# Patient Record
Sex: Male | Born: 1945
Health system: Southern US, Community
[De-identification: ages and names within clinical notes are randomized; demographics above are authoritative.]

## PROBLEM LIST (undated history)

## (undated) DIAGNOSIS — E7800 Pure hypercholesterolemia, unspecified: Secondary | ICD-10-CM

## (undated) DIAGNOSIS — K219 Gastro-esophageal reflux disease without esophagitis: Secondary | ICD-10-CM

## (undated) DIAGNOSIS — K56609 Unspecified intestinal obstruction, unspecified as to partial versus complete obstruction: Secondary | ICD-10-CM

## (undated) DIAGNOSIS — I10 Essential (primary) hypertension: Secondary | ICD-10-CM

## (undated) DIAGNOSIS — M1990 Unspecified osteoarthritis, unspecified site: Secondary | ICD-10-CM

## (undated) DIAGNOSIS — F4310 Post-traumatic stress disorder, unspecified: Secondary | ICD-10-CM

## (undated) HISTORY — PX: KNEE ARTHROSCOPY: SHX127

## (undated) HISTORY — PX: ESOPHAGOGASTRODUODENOSCOPY: SHX1529

## (undated) HISTORY — PX: OTHER SURGICAL HISTORY: SHX169

## (undated) HISTORY — PX: COLONOSCOPY: SHX174

## (undated) HISTORY — PX: BACK SURGERY: SHX140

---

## 2006-01-31 ENCOUNTER — Ambulatory Visit: Payer: Self-pay | Admitting: Internal Medicine

## 2006-01-31 ENCOUNTER — Ambulatory Visit (HOSPITAL_COMMUNITY): Admission: RE | Admit: 2006-01-31 | Discharge: 2006-01-31 | Payer: Self-pay | Admitting: Internal Medicine

## 2006-06-17 ENCOUNTER — Ambulatory Visit (HOSPITAL_COMMUNITY): Admission: RE | Admit: 2006-06-17 | Discharge: 2006-06-17 | Payer: Self-pay | Admitting: Family Medicine

## 2007-09-28 ENCOUNTER — Ambulatory Visit (HOSPITAL_COMMUNITY): Admission: RE | Admit: 2007-09-28 | Discharge: 2007-09-28 | Payer: Self-pay | Admitting: Family Medicine

## 2011-01-25 NOTE — Op Note (Signed)
NAME:  Garrett Higgins, Garrett Higgins               ACCOUNT NO.:  [id number]   MEDICAL RECORD NO.:  [id number]          PATIENT TYPE:  AMB   LOCATION:  DAY                           FACILITY:  [facility]   PHYSICIAN:  Lionel December, M.D.    DATE OF BIRTH:  06-Aug-1946   DATE OF PROCEDURE:  01/31/2006  DATE OF DISCHARGE:                                 OPERATIVE REPORT   PROCEDURE:  Colonoscopy.   INDICATIONS FOR PROCEDURE:  Garrett Higgins is a 65 year old Caucasian male who is  here for screening colonoscopy.  Family history is negative for colorectal  carcinoma.  The procedure risks were reviewed with the patient and informed  consent was obtained.   MEDS FOR CONSCIOUS SEDATION:  Demerol 25 mg IV, Versed 5 mg IV.   FINDINGS:  The procedure was performed in the endoscopy suite.  The  patient's vital signs and O2 sats were monitored during the procedure and  remained stable.  The patient was placed in the left lateral position and  rectal examination performed.  No abnormality was noted on external or  digital exam.  The Olympus videoscope was placed in the rectum and advanced  under vision in the sigmoid colon and beyond.  Preparation was satisfactory.  The scope was passed in the cecum which was identified by the appendiceal  orifice and ileocecal valve.  A short segment of TI was also exam and was  normal.  As the scope was withdrawn, the colonic mucosa was carefully  examined.  There are no polyps or other mucosal abnormalities.  The rectal  mucosa, similarly, was normal.  The scope was retroflexed to examine the  anorectal junction and small hemorrhoids were noted below the dentate line.  The endoscope was straightened and withdrawn.  The patient tolerated the  procedure well.   FINAL DIAGNOSIS:  Small external hemorrhoids, otherwise, normal colonoscopy.   RECOMMENDATIONS:  1.  He will resume his usual meds and diet.  2.  Yearly Hemoccults and he may consider next screening exam in ten years      from  now.      Lionel December, M.D.  Electronically Signed     NR/MEDQ  D:  01/31/2006  T:  01/31/2006  Job:  [id number]   cc:   Patrica Duel, M.D.  Fax: [phone]

## 2011-10-17 ENCOUNTER — Other Ambulatory Visit (HOSPITAL_COMMUNITY): Payer: Self-pay | Admitting: Internal Medicine

## 2011-10-17 ENCOUNTER — Ambulatory Visit (HOSPITAL_COMMUNITY)
Admission: RE | Admit: 2011-10-17 | Discharge: 2011-10-17 | Disposition: A | Discharge status: Home / Self Care | Payer: Medicare HMO | Source: Ambulatory Visit | Attending: Internal Medicine | Admitting: Internal Medicine

## 2011-10-17 DIAGNOSIS — R05 Cough: Secondary | ICD-10-CM

## 2011-10-17 DIAGNOSIS — J069 Acute upper respiratory infection, unspecified: Secondary | ICD-10-CM

## 2011-10-17 DIAGNOSIS — R059 Cough, unspecified: Secondary | ICD-10-CM | POA: Insufficient documentation

## 2012-02-29 ENCOUNTER — Emergency Department (HOSPITAL_COMMUNITY): Payer: Medicare HMO

## 2012-02-29 ENCOUNTER — Inpatient Hospital Stay (HOSPITAL_COMMUNITY)
Admission: EM | Admit: 2012-02-29 | Discharge: 2012-03-01 | DRG: 812 | Disposition: A | Discharge status: Home / Self Care | Payer: Medicare HMO | Attending: Internal Medicine | Admitting: Internal Medicine

## 2012-02-29 ENCOUNTER — Encounter (HOSPITAL_COMMUNITY): Payer: Self-pay

## 2012-02-29 DIAGNOSIS — K2980 Duodenitis without bleeding: Secondary | ICD-10-CM | POA: Diagnosis present

## 2012-02-29 DIAGNOSIS — E785 Hyperlipidemia, unspecified: Secondary | ICD-10-CM | POA: Diagnosis present

## 2012-02-29 DIAGNOSIS — R112 Nausea with vomiting, unspecified: Secondary | ICD-10-CM

## 2012-02-29 DIAGNOSIS — D649 Anemia, unspecified: Secondary | ICD-10-CM | POA: Diagnosis present

## 2012-02-29 DIAGNOSIS — I10 Essential (primary) hypertension: Secondary | ICD-10-CM | POA: Diagnosis present

## 2012-02-29 DIAGNOSIS — Z79899 Other long term (current) drug therapy: Secondary | ICD-10-CM

## 2012-02-29 DIAGNOSIS — F17200 Nicotine dependence, unspecified, uncomplicated: Secondary | ICD-10-CM | POA: Diagnosis present

## 2012-02-29 DIAGNOSIS — K219 Gastro-esophageal reflux disease without esophagitis: Secondary | ICD-10-CM | POA: Diagnosis present

## 2012-02-29 DIAGNOSIS — K449 Diaphragmatic hernia without obstruction or gangrene: Secondary | ICD-10-CM | POA: Diagnosis present

## 2012-02-29 DIAGNOSIS — D131 Benign neoplasm of stomach: Secondary | ICD-10-CM | POA: Diagnosis present

## 2012-02-29 DIAGNOSIS — D509 Iron deficiency anemia, unspecified: Principal | ICD-10-CM | POA: Diagnosis present

## 2012-02-29 DIAGNOSIS — E7800 Pure hypercholesterolemia, unspecified: Secondary | ICD-10-CM | POA: Diagnosis present

## 2012-02-29 HISTORY — DX: Essential (primary) hypertension: I10

## 2012-02-29 HISTORY — DX: Pure hypercholesterolemia, unspecified: E78.00

## 2012-02-29 HISTORY — DX: Gastro-esophageal reflux disease without esophagitis: K21.9

## 2012-02-29 LAB — BASIC METABOLIC PANEL
BUN: 20 mg/dL (ref 6–23)
CO2: 22 mEq/L (ref 19–32)
Chloride: 105 mEq/L (ref 96–112)
Creatinine, Ser: 1.04 mg/dL (ref 0.50–1.35)
Glucose, Bld: 110 mg/dL — ABNORMAL HIGH (ref 70–99)
Potassium: 4.6 mEq/L (ref 3.5–5.1)

## 2012-02-29 LAB — IRON AND TIBC
Iron: 11 ug/dL — ABNORMAL LOW (ref 42–135)
UIBC: 521 ug/dL — ABNORMAL HIGH (ref 125–400)

## 2012-02-29 LAB — HEPATIC FUNCTION PANEL
ALT: 12 U/L (ref 0–53)
AST: 16 U/L (ref 0–37)
Alkaline Phosphatase: 74 U/L (ref 39–117)
Bilirubin, Direct: <0.1 mg/dL (ref 0.0–0.3)
Total Bilirubin: 0.2 mg/dL — ABNORMAL LOW (ref 0.3–1.2)

## 2012-02-29 LAB — DIFFERENTIAL
Basophils Absolute: 0 10*3/uL (ref 0.0–0.1)
Basophils Relative: 0 % (ref 0–1)
Lymphocytes Relative: 15 % (ref 12–46)
Lymphs Abs: 1 10*3/uL (ref 0.7–4.0)
Monocytes Relative: 5 % (ref 3–12)
Neutro Abs: 5.3 10*3/uL (ref 1.7–7.7)

## 2012-02-29 LAB — RETICULOCYTES: Retic Count, Absolute: 57 10*3/uL (ref 19.0–186.0)

## 2012-02-29 LAB — VITAMIN B12: Vitamin B-12: 254 pg/mL (ref 211–911)

## 2012-02-29 LAB — CBC
HCT: 26.1 % — ABNORMAL LOW (ref 39.0–52.0)
Hemoglobin: 7.3 g/dL — ABNORMAL LOW (ref 13.0–17.0)
MCV: 68.3 fL — ABNORMAL LOW (ref 78.0–100.0)
RDW: 18.7 % — ABNORMAL HIGH (ref 11.5–15.5)
WBC: 6.8 10*3/uL (ref 4.0–10.5)

## 2012-02-29 LAB — ABO/RH
ABO/RH(D): AB POS
ABO/RH(D): AB POS

## 2012-02-29 LAB — FOLATE: Folate: 18.1 ng/mL

## 2012-02-29 MED ORDER — PANTOPRAZOLE SODIUM 40 MG PO TBEC
40.0000 mg | DELAYED_RELEASE_TABLET | Freq: Every day | ORAL | Status: DC
  Administered 2012-02-29 – 2012-03-01 (×2): 40 mg via ORAL
  Filled 2012-02-29 (×2): qty 1

## 2012-02-29 MED ORDER — HYDROCODONE-ACETAMINOPHEN 5-325 MG PO TABS
1.0000 | ORAL_TABLET | ORAL | Status: DC | PRN
  Administered 2012-02-29 – 2012-03-01 (×2): 1 via ORAL
  Filled 2012-02-29 (×2): qty 1

## 2012-02-29 MED ORDER — SODIUM CHLORIDE 0.9 % IV SOLN: 250.0000 mL | INTRAVENOUS | Status: DC | PRN

## 2012-02-29 MED ORDER — SODIUM CHLORIDE 0.9 % IJ SOLN
3.0000 mL | INTRAMUSCULAR | Status: DC | PRN
  Filled 2012-02-29: qty 3

## 2012-02-29 MED ORDER — LISINOPRIL 10 MG PO TABS
10.0000 mg | ORAL_TABLET | Freq: Every day | ORAL | Status: DC
  Administered 2012-03-01: 10 mg via ORAL
  Filled 2012-02-29 (×2): qty 1

## 2012-02-29 MED ORDER — SODIUM CHLORIDE 0.9 % IV BOLUS (SEPSIS)
250.0000 mL | Freq: Once | INTRAVENOUS | Status: AC
  Administered 2012-02-29: 250 mL via INTRAVENOUS

## 2012-02-29 MED ORDER — ONDANSETRON HCL 4 MG/2ML IJ SOLN: 4.0000 mg | Freq: Four times a day (QID) | INTRAMUSCULAR | Status: DC | PRN

## 2012-02-29 MED ORDER — ONDANSETRON HCL 4 MG PO TABS: 4.0000 mg | ORAL_TABLET | Freq: Four times a day (QID) | ORAL | Status: DC | PRN

## 2012-02-29 MED ORDER — SODIUM CHLORIDE 0.9 % IJ SOLN
3.0000 mL | Freq: Two times a day (BID) | INTRAMUSCULAR | Status: DC
  Administered 2012-02-29 – 2012-03-01 (×2): 3 mL via INTRAVENOUS
  Filled 2012-02-29: qty 3

## 2012-02-29 MED ORDER — SODIUM CHLORIDE 0.9 % IV SOLN: INTRAVENOUS | Status: DC

## 2012-02-29 MED ORDER — HYDROCHLOROTHIAZIDE 25 MG PO TABS
12.5000 mg | ORAL_TABLET | ORAL | Status: DC
  Administered 2012-03-01: 12.5 mg via ORAL
  Filled 2012-02-29: qty 0.5
  Filled 2012-02-29: qty 1

## 2012-02-29 MED ORDER — ATORVASTATIN CALCIUM 40 MG PO TABS
80.0000 mg | ORAL_TABLET | Freq: Every day | ORAL | Status: DC
  Administered 2012-02-29 – 2012-03-01 (×2): 80 mg via ORAL
  Filled 2012-02-29: qty 2
  Filled 2012-02-29: qty 1
  Filled 2012-02-29: qty 2

## 2012-02-29 MED ORDER — ZOLPIDEM TARTRATE 5 MG PO TABS
10.0000 mg | ORAL_TABLET | Freq: Every evening | ORAL | Status: DC | PRN
  Administered 2012-02-29: 10 mg via ORAL
  Filled 2012-02-29: qty 2
  Filled 2012-02-29: qty 1
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
## 2012-02-29 NOTE — ED Notes (Signed)
Talked with edp about blood. Advised to order new blood and test again per labs suggestion. Pt aware.

## 2012-02-29 NOTE — ED Notes (Signed)
Pt states he went to the [place] yesterday. States he was told he need a blood transfusion
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
## 2012-03-01 ENCOUNTER — Encounter (HOSPITAL_COMMUNITY): Payer: Self-pay | Admitting: *Deleted

## 2012-03-01 ENCOUNTER — Encounter (HOSPITAL_COMMUNITY)
Admission: EM | Disposition: A | Discharge status: Home / Self Care | Payer: Self-pay | Source: Home / Self Care | Attending: Internal Medicine

## 2012-03-01 ENCOUNTER — Inpatient Hospital Stay (HOSPITAL_COMMUNITY): Payer: Medicare HMO

## 2012-03-01 DIAGNOSIS — R112 Nausea with vomiting, unspecified: Secondary | ICD-10-CM

## 2012-03-01 DIAGNOSIS — D509 Iron deficiency anemia, unspecified: Secondary | ICD-10-CM

## 2012-03-01 DIAGNOSIS — D131 Benign neoplasm of stomach: Secondary | ICD-10-CM

## 2012-03-01 DIAGNOSIS — K449 Diaphragmatic hernia without obstruction or gangrene: Secondary | ICD-10-CM

## 2012-03-01 DIAGNOSIS — I10 Essential (primary) hypertension: Secondary | ICD-10-CM

## 2012-03-01 HISTORY — PX: ESOPHAGOGASTRODUODENOSCOPY: SHX5428

## 2012-03-01 LAB — CBC
MCH: 21.1 pg — ABNORMAL LOW (ref 26.0–34.0)
MCHC: 29.2 g/dL — ABNORMAL LOW (ref 30.0–36.0)
MCV: 72.4 fL — ABNORMAL LOW (ref 78.0–100.0)
Platelets: 217 10*3/uL (ref 150–400)

## 2012-03-01 LAB — TYPE AND SCREEN
Antibody Screen: NEGATIVE
Unit division: 0

## 2012-03-01 LAB — COMPREHENSIVE METABOLIC PANEL
ALT: 11 U/L (ref 0–53)
AST: 13 U/L (ref 0–37)
Alkaline Phosphatase: 73 U/L (ref 39–117)
CO2: 24 mEq/L (ref 19–32)
Chloride: 105 mEq/L (ref 96–112)
Creatinine, Ser: 1.01 mg/dL (ref 0.50–1.35)
GFR calc non Af Amer: 74 mL/min — ABNORMAL LOW (ref >90)
Potassium: 4.5 mEq/L (ref 3.5–5.1)
Sodium: 138 mEq/L (ref 135–145)
Total Bilirubin: 0.8 mg/dL (ref 0.3–1.2)

## 2012-03-01 SURGERY — EGD (ESOPHAGOGASTRODUODENOSCOPY)
Anesthesia: Moderate Sedation

## 2012-03-01 MED ORDER — SODIUM CHLORIDE 0.9 % IV SOLN
250.0000 mL | INTRAVENOUS | Status: DC | PRN
  Administered 2012-03-01: 250 mL via INTRAVENOUS

## 2012-03-01 MED ORDER — BUTAMBEN-TETRACAINE-BENZOCAINE 2-2-14 % EX AERO
INHALATION_SPRAY | CUTANEOUS | Status: DC | PRN
  Administered 2012-03-01: 2 via TOPICAL

## 2012-03-01 MED ORDER — MIDAZOLAM HCL 5 MG/5ML IJ SOLN
INTRAMUSCULAR | Status: AC
  Filled 2012-03-01: qty 10

## 2012-03-01 MED ORDER — IOHEXOL 300 MG/ML  SOLN
40.0000 mL | Freq: Once | INTRAMUSCULAR | Status: AC | PRN
  Administered 2012-03-01: 40 mL via ORAL

## 2012-03-01 MED ORDER — IOHEXOL 300 MG/ML  SOLN
100.0000 mL | Freq: Once | INTRAMUSCULAR | Status: AC | PRN
  Administered 2012-03-01: 100 mL via INTRAVENOUS

## 2012-03-01 MED ORDER — MIDAZOLAM HCL 5 MG/5ML IJ SOLN
INTRAMUSCULAR | Status: DC | PRN
  Administered 2012-03-01 (×2): 2 mg via INTRAVENOUS

## 2012-03-01 MED ORDER — FERROUS SULFATE 325 (65 FE) MG PO TBEC: 325.0000 mg | DELAYED_RELEASE_TABLET | Freq: Three times a day (TID) | ORAL | Status: DC

## 2012-03-01 MED ORDER — MEPERIDINE HCL 100 MG/ML IJ SOLN
INTRAMUSCULAR | Status: AC
  Filled 2012-03-01: qty 2

## 2012-03-01 MED ORDER — MEPERIDINE HCL 100 MG/ML IJ SOLN
INTRAMUSCULAR | Status: DC | PRN
  Administered 2012-03-01: 25 mg via INTRAVENOUS
  Administered 2012-03-01: 50 mg via INTRAVENOUS

## 2012-03-01 NOTE — Op Note (Addendum)
[facility] [address] [place], [place]  [address]  ENDOSCOPY PROCEDURE REPORT  PATIENT:  Garrett Higgins, Garrett Higgins  MR#:  [id number] BIRTHDATE:  1946-04-04, 66 yrs. old  GENDER:  male  ENDOSCOPIST:  Jonette Eva, MD Referred by:  Karleen Hampshire, M.D. [facility]  PROCEDURE DATE:  03/01/2012 PROCEDURE:  EGD with biopsy, [id number] ASA CLASS: INDICATIONS:  IRON DEFICIENCY ANEMIA, FERRITIN 4, HB 7.2 & GIVEN 2uPrbcs HB 9.1  LAST TCS 2007: SML EXT HEMORRHOIDS  MEDICATIONS:   Demerol 75 mg IV, Versed 4 mg IV TOPICAL ANESTHETIC:  Cetacaine Spray  DESCRIPTION OF PROCEDURE:     Physical exam was performed. Informed consent was obtained from the patient after explaining the benefits, risks, and alternatives to the procedure.  The patient was connected to the monitor and placed in the left lateral position.  Continuous oxygen was provided by nasal cannula and IV medicine administered through an indwelling cannula.  After administration of sedation, the patient's esophagus was intubated and the EG-2990i ([id number]) and EG-2990i ([id number]) endoscope was advanced under direct visualization to the second portion of the duodenum.  The scope was removed slowly by carefully examining the color, texture, anatomy, and integrity of the mucosa on the way out.  The patient was recovered in endoscopy and discharged home in satisfactory condition. <<PROCEDUREIMAGES>>  NL ESOPHAGUS. NO BARETT'S. A 1-2 CM hiatal hernia was found. There were multiple, BENIGN polyps identified. in the fundus &BIOPSIED VIA COLD FORCEPS.  MILD ERYTHEMA IN TEH DUODENUM-BIOPSIES OBTAINED VIA COLD FORCEPS TO EVALUATE CELIAC SPRUE.  COMPLICATIONS:    None  ENDOSCOPIC IMPRESSION: 1) SMALL Hiatal hernia 2) Polyps, multiple in the fundus 3) NO OBVIOUS SOURCE FOR FEDA IDENTIFIED. 4) MILD DUODENITIS PT LOW RISK FOR BLEEDING  RECOMMENDATIONS: AWAIT BIOPSIES OMP QD LOW FAT DIET Stop the dicloenac. START FESO4 TID OK  TOP D/C HOME. IF PT DOES NOT HAVE ATROPHIC GASTRITIS, NEEDS TCS AS OP. COPY GIVEN TO PT'S WIFE. WILL CALL [phone](PT) OR [phone] WITH RESULTS.  REPEAT EXAM:  No  ______________________________ Jonette Eva, MD  CC:  n. REVISED:  03/01/2012 01:38 PM eSIGNED:   Barret Esquivel at 03/01/2012 01:38 PM  Keturah Barre, [phone]

## 2012-03-01 NOTE — Discharge Summary (Signed)
Physician Discharge Summary  Patient ID: Garrett Higgins MRN: [id number] DOB/AGE: 1945/12/12 66 y.o. Primary Care Physician:MCGOUGH,WILLIAM M, MD Admit date: 02/29/2012 Discharge date: 03/01/2012    Discharge Diagnoses:  1. Iron deficiency anemia. Status post 2 units blood. EGD findings as follows: 1) SMALL Hiatal hernia  2) Polyps, multiple in the fundus  3) NO OBVIOUS SOURCE FOR FEDA IDENTIFIED.  4) MILD DUODENITIS  2. Hypertension. 3. GERD. 4. Hyperlipidemia.    Medication List  As of 03/01/2012  4:39 PM   STOP taking these medications         diclofenac 75 MG EC tablet         TAKE these medications         citalopram 40 MG tablet   Commonly known as: CELEXA   Take 40 mg by mouth daily.      ferrous sulfate 325 (65 FE) MG EC tablet   Take 1 tablet (325 mg total) by mouth 3 (three) times daily with meals.      hydrochlorothiazide 25 MG tablet   Commonly known as: HYDRODIURIL   Take 12.5 mg by mouth every other day.      HYDROcodone-acetaminophen 5-500 MG per tablet   Commonly known as: VICODIN   Take 0.5-1 tablets by mouth 2 (two) times daily as needed. FOR PAIN      lidocaine 2 % jelly   Commonly known as: XYLOCAINE   Apply 1 application topically as needed. FOR BACK PAIN AND JOINT PAIN      lisinopril 20 MG tablet   Commonly known as: PRINIVIL,ZESTRIL   Take 10 mg by mouth daily.      omeprazole 20 MG capsule   Commonly known as: PRILOSEC   Take 20 mg by mouth daily.      rosuvastatin 40 MG tablet   Commonly known as: CRESTOR   Take 20 mg by mouth daily.      sildenafil 100 MG tablet   Commonly known as: VIAGRA   Take 50-100 mg by mouth daily as needed. FOR SEXUAL ACTIVITY      zolpidem 10 MG tablet   Commonly known as: AMBIEN   Take 10 mg by mouth at bedtime as needed. FOR SLEEP            Discharged Condition: Stable.    Consults: Gastroenterology, Dr. Darrick Penna.  Significant Diagnostic Studies: Dg Chest 2 View  02/29/2012  *RADIOLOGY  REPORT*  Clinical Data: 66 year old male with anemia.  CHEST - 2 VIEW  Comparison: 10/17/2011  Findings: The cardiomediastinal silhouette is unremarkable. The lungs are clear. There is no evidence of focal airspace disease, pulmonary edema, suspicious pulmonary nodule/mass, pleural effusion, or pneumothorax. No acute bony abnormalities are identified. Metallic foreign bodies overlying the left chest and back are again noted.  IMPRESSION: No evidence of active cardiopulmonary disease.  Original Report Authenticated By: Rosendo Gros, M.D.   Ct Abdomen Pelvis W Contrast  03/01/2012  *RADIOLOGY REPORT*  Clinical Data: Anorexia and weight loss.  Anemia.  CT ABDOMEN AND PELVIS WITH CONTRAST  Technique:  Multidetector CT imaging of the abdomen and pelvis was performed following the standard protocol during bolus administration of intravenous contrast.  Contrast: OMNIPAQUE IOHEXOL 300 MG/ML  SOLN  Comparison: None.  Findings: The lung bases are clear.  Small pleural lipoma versus diaphragmatic hernia on the left.  The liver is unremarkable.  No focal lesions or intrahepatic biliary dilatation.  The gallbladder is normal.  No common bile duct dilatation.  The pancreas is normal.  The spleen is normal in size.  No focal lesions.  Surgical clips noted near the spleen. The adrenal glands and kidneys are unremarkable.  The stomach, duodenum, small bowel and colon are unremarkable.  No inflammatory changes or mass lesions.  No mesenteric or retroperitoneal masses or lymphadenopathy.  The appendix is normal. The aorta is normal in caliber.  Moderate atherosclerotic calcifications.  The major branch vessels are patent.  Small scattered lymph nodes are noted.  The bladder, prostate gland and seminal vesicles are unremarkable. No pelvic mass or adenopathy.  No inguinal mass or adenopathy.  A small left inguinal hernia is noted containing fat.  The bony structures are unremarkable.  IMPRESSION: No acute abdominal/pelvic  findings, mass lesions or adenopathy.  Original Report Authenticated By: P. Loralie Champagne, M.D.    Lab Results: Basic Metabolic Panel:  Basename 03/01/12 0612 02/29/12 0927  NA 138 137  K 4.5 4.6  CL 105 105  CO2 24 22  GLUCOSE 107* 110*  BUN 12 20  CREATININE 1.01 1.04  CALCIUM 9.9 9.7  MG -- --  PHOS -- --   Liver Function Tests:  [facility] 03/01/12 0612 02/29/12 0927  AST 13 16  ALT 11 12  ALKPHOS 73 74  BILITOT 0.8 0.2*  PROT 6.6 6.9  ALBUMIN 3.6 3.8     CBC:  Basename 03/01/12 0612 02/29/12 0927  WBC 4.5 6.8  NEUTROABS -- 5.3  HGB 9.2* 7.3*  HCT 31.5* 26.1*  MCV 72.4* 68.3*  PLT 217 247       Hospital Course: This 66 year old man presents to the hospital after he was found to be anemic after he has had blood taken from the [place]. 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 Apparently his hemoglobin was clearly different yesterday when it was taken compared to 6 months ago. He was therefore referred to his primary care physician, who referred him to the emergency room. He does describe some lightheadedness and feeling generally tired in the last few weeks. He also describes weight loss, unintentional in the last few weeks. His appetite is rather poor. He denies any hematemesis, rectal bleeding or melena. He has had no abdominal pain. He did have a colonoscopy in 2007 which revealed small external hemorrhoids, otherwise normal. He was given 2 units blood transfusion. He was seen by gastroenterology who performed EGD, the findings are as above. CT scan of his abdomen was done and this was negative. He has remained stable. He is going to be discharged with omeprazole that is already taking. He has been advised to stop diclofenac. He may well need outpatient colonoscopy.  Discharge Exam: Blood pressure 106/70, pulse 67, temperature 97.9 F (36.6 C), temperature source Oral, resp. rate 16, height 5\' 11"  (1.803 m), weight 88.8 kg (195 lb 12.3 oz), SpO2  94.00%. He looks systemically well. Heart sounds present and normal. Lung fields are clear. Abdomen is soft and nontender. He is alert and orientated.  Disposition: Home. He'll follow with  Dr. Darrick Penna.  Discharge Orders    Future Orders Please Complete By Expires   Diet - low sodium heart healthy      Increase activity slowly         Follow-up Information    Follow up with Jonette Eva, MD. Schedule an appointment as soon as possible for a visit in 2 weeks.   Contact information:   [address]  [place] [address] [phone]          SignedWilson Singer Pager [phone]  03/01/2012, 4:39 PM
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
## 2012-03-01 NOTE — Progress Notes (Signed)
Subjective: This man was given 2 units of blood yesterday without any problems. His anemia panel shows clear evidence of iron deficiency. Dr Darrick Penna, gastroenterology, will see him today and likely do EGD today.           Physical Exam: Blood pressure 115/75, pulse 65, temperature 97.9 F (36.6 C), temperature source Oral, resp. rate 16, height 5\' 11"  (1.803 m), weight 88.8 kg (195 lb 12.3 oz), SpO2 100.00%. He looks systemically well. Heart sounds are present and normal. Lung fields are clear. There are no new changes since he was admitted yesterday.        Basic Metabolic Panel:  Basename 03/01/12 0612 02/29/12 0927  NA 138 137  K 4.5 4.6  CL 105 105  CO2 24 22  GLUCOSE 107* 110*  BUN 12 20  CREATININE 1.01 1.04  CALCIUM 9.9 9.7  MG -- --  PHOS -- --   Liver Function Tests:  [facility] 03/01/12 0612 02/29/12 0927  AST 13 16  ALT 11 12  ALKPHOS 73 74  BILITOT 0.8 0.2*  PROT 6.6 6.9  ALBUMIN 3.6 3.8     CBC:  Basename 03/01/12 0612 02/29/12 0927  WBC 4.5 6.8  NEUTROABS -- 5.3  HGB 9.2* 7.3*  HCT 31.5* 26.1*  MCV 72.4* 68.3*  PLT 217 247    Dg Chest 2 View  02/29/2012  *RADIOLOGY REPORT*  Clinical Data: 66 year old male with anemia.  CHEST - 2 VIEW  Comparison: 10/17/2011  Findings: The cardiomediastinal silhouette is unremarkable. The lungs are clear. There is no evidence of focal airspace disease, pulmonary edema, suspicious pulmonary nodule/mass, pleural effusion, or pneumothorax. No acute bony abnormalities are identified. Metallic foreign bodies overlying the left chest and back are again noted.  IMPRESSION: No evidence of active cardiopulmonary disease.  Original Report Authenticated By: Rosendo Gros, M.D.      Medications:  Scheduled:   . atorvastatin  80 mg Oral q1800  . hydrochlorothiazide  12.5 mg Oral QODAY  . lisinopril  10 mg Oral Daily  . pantoprazole  40 mg Oral Q1200  . sodium chloride  250 mL Intravenous Once  . sodium chloride  3  mL Intravenous Q12H    Impression: 1. Iron deficiency anemia, unclear etiology at this time. History of nonsteroidal anti-inflammatory drugs and alcohol. 2. Hypertension. 3. GERD. 4. Hyperlipidemia.     Plan: 1. Keep n.p.o. 2. EGD today.     LOS: 1 day   Wilson Singer Pager [phone]  03/01/2012, 8:28 AM
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
## 2012-03-01 NOTE — Progress Notes (Signed)
Patient discharged home with wife.  Instructed to start taking ferrous sulfate 3 x a day with meals.  And to stop taking diclofonac.  Instructed to make follow up appoint with Dr. Darrick Penna, and that Dr. Darrick Penna should be contacting when results from biopsies are back.  Patient has no questions and feels ready to go home.  IV removed - WNL

## 2012-03-03 ENCOUNTER — Telehealth: Payer: Self-pay | Admitting: Gastroenterology

## 2012-03-03 ENCOUNTER — Encounter (HOSPITAL_COMMUNITY): Payer: Self-pay | Admitting: Gastroenterology

## 2012-03-03 NOTE — Progress Notes (Signed)
UR Chart Review Completed  

## 2012-03-03 NOTE — Telephone Encounter (Signed)
Also received call from Dr. Darrick Penna. We are in the process of scheduling the surgery for colonoscopy.

## 2012-03-03 NOTE — Telephone Encounter (Signed)
CALLED PT. DISCUSSED RESULTS WITH PT. WOULD LIKE DR. Karilyn Cota TO PERFORM HIS REPEAT TCS.

## 2012-03-04 ENCOUNTER — Other Ambulatory Visit (INDEPENDENT_AMBULATORY_CARE_PROVIDER_SITE_OTHER): Payer: Self-pay | Admitting: *Deleted

## 2012-03-04 ENCOUNTER — Telehealth (INDEPENDENT_AMBULATORY_CARE_PROVIDER_SITE_OTHER): Payer: Self-pay | Admitting: *Deleted

## 2012-03-04 ENCOUNTER — Encounter (HOSPITAL_COMMUNITY): Payer: Self-pay

## 2012-03-04 DIAGNOSIS — K625 Hemorrhage of anus and rectum: Secondary | ICD-10-CM

## 2012-03-04 DIAGNOSIS — Z1211 Encounter for screening for malignant neoplasm of colon: Secondary | ICD-10-CM

## 2012-03-04 MED ORDER — PEG-KCL-NACL-NASULF-NA ASC-C 100 G PO SOLR: 1.0000 | Freq: Once | ORAL | Status: DC

## 2012-03-04 NOTE — Telephone Encounter (Signed)
PCP/Requesting MD: mcgough  Name & DOB: rajan burgard 01/27/2046    Procedure: tcs  Reason/Indication:  Rectal bleeding (schd per NUR)  Has patient had this procedure before?  yes  If so, when, by whom and where?  2007  Is there a family history of colon cancer?  no  Who?  What age when diagnosed?    Is patient diabetic?   no      Does patient have prosthetic heart valve?  no  Do you have a pacemaker?  no  Has patient had joint replacement within last 12 months?  no  Is patient on Coumadin, Plavix and/or Aspirin? no  Medications: HCTZ 25 mg 1/2 tab every other day, rosuvastatin 40 mg 1/2 tab daily, hydrocodone 500 mg prn, lisinopril 20 mg 1/2 tab daily, iron 325 mg tid  Allergies: nkda  Medication Adjustment: iron 7 days  Procedure date & time: 03/13/12 at 730

## 2012-03-04 NOTE — Telephone Encounter (Signed)
TCS sch'd 03/13/12, patient aware

## 2012-03-04 NOTE — Telephone Encounter (Signed)
agree

## 2012-03-13 ENCOUNTER — Encounter (HOSPITAL_COMMUNITY): Payer: Self-pay

## 2012-03-13 ENCOUNTER — Ambulatory Visit (HOSPITAL_COMMUNITY)
Admission: RE | Admit: 2012-03-13 | Discharge: 2012-03-13 | Disposition: A | Discharge status: Home Health | Payer: Medicare HMO | Source: Ambulatory Visit | Attending: Internal Medicine | Admitting: Internal Medicine

## 2012-03-13 ENCOUNTER — Encounter (HOSPITAL_COMMUNITY)
Admission: RE | Disposition: A | Discharge status: Home Health | Payer: Self-pay | Source: Ambulatory Visit | Attending: Internal Medicine

## 2012-03-13 DIAGNOSIS — Z01812 Encounter for preprocedural laboratory examination: Secondary | ICD-10-CM | POA: Insufficient documentation

## 2012-03-13 DIAGNOSIS — K625 Hemorrhage of anus and rectum: Secondary | ICD-10-CM

## 2012-03-13 DIAGNOSIS — Z79899 Other long term (current) drug therapy: Secondary | ICD-10-CM | POA: Insufficient documentation

## 2012-03-13 DIAGNOSIS — Z9889 Other specified postprocedural states: Secondary | ICD-10-CM

## 2012-03-13 DIAGNOSIS — D126 Benign neoplasm of colon, unspecified: Secondary | ICD-10-CM | POA: Insufficient documentation

## 2012-03-13 DIAGNOSIS — D509 Iron deficiency anemia, unspecified: Secondary | ICD-10-CM | POA: Insufficient documentation

## 2012-03-13 DIAGNOSIS — E7800 Pure hypercholesterolemia, unspecified: Secondary | ICD-10-CM | POA: Insufficient documentation

## 2012-03-13 DIAGNOSIS — I10 Essential (primary) hypertension: Secondary | ICD-10-CM | POA: Insufficient documentation

## 2012-03-13 DIAGNOSIS — K644 Residual hemorrhoidal skin tags: Secondary | ICD-10-CM | POA: Insufficient documentation

## 2012-03-13 HISTORY — DX: Unspecified osteoarthritis, unspecified site: M19.90

## 2012-03-13 HISTORY — PX: COLONOSCOPY: SHX5424

## 2012-03-13 LAB — HEMOGLOBIN: Hemoglobin: 10.2 g/dL — ABNORMAL LOW (ref 13.0–17.0)

## 2012-03-13 SURGERY — COLONOSCOPY
Anesthesia: Moderate Sedation

## 2012-03-13 MED ORDER — MEPERIDINE HCL 50 MG/ML IJ SOLN
INTRAMUSCULAR | Status: AC
  Filled 2012-03-13: qty 1

## 2012-03-13 MED ORDER — MEPERIDINE HCL 50 MG/ML IJ SOLN
INTRAMUSCULAR | Status: DC | PRN
  Administered 2012-03-13 (×2): 25 mg via INTRAVENOUS

## 2012-03-13 MED ORDER — MIDAZOLAM HCL 5 MG/5ML IJ SOLN
INTRAMUSCULAR | Status: DC | PRN
  Administered 2012-03-13: 2 mg via INTRAVENOUS
  Administered 2012-03-13: 1 mg via INTRAVENOUS
  Administered 2012-03-13: 2 mg via INTRAVENOUS

## 2012-03-13 MED ORDER — SODIUM CHLORIDE 0.45 % IV SOLN
Freq: Once | INTRAVENOUS | Status: AC
  Administered 2012-03-13: 07:00:00 via INTRAVENOUS

## 2012-03-13 MED ORDER — MIDAZOLAM HCL 5 MG/5ML IJ SOLN
INTRAMUSCULAR | Status: AC
  Filled 2012-03-13: qty 10

## 2012-03-13 MED ORDER — STERILE WATER FOR IRRIGATION IR SOLN
Status: DC | PRN
  Administered 2012-03-13: 07:00:00
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
## 2012-03-13 NOTE — Op Note (Signed)
COLONOSCOPY PROCEDURE REPORT  PATIENT:  Garrett Higgins  MR#:  [id number] Birthdate:  07/14/1946, 66 y.o., male Endoscopist:  Dr. Malissa Hippo, MD Referred By:  Dr. Kirk Ruths, M.D. Procedure Date: 03/13/2012  Procedure:   Colonoscopy  Indications:  Patient is 46 usual Caucasian male who was recently hospitalized for microcytic anemia and confirmed to be due to iron deficiency anemia. He received 2 units of PRBCs. He has been diclofenac. He underwent an esophagogastroduodenoscopy by Dr. Darrick Penna and no lesion was found. He is therefore returning for colonoscopy. His last colonoscopy was in may 2007 and was normal other than hemorrhoids.  Informed Consent:  The procedure and risks were reviewed with the patient and informed consent was obtained.  Medications:  Demerol 50 mg IV Versed 5 mg IV  Description of procedure:  After a digital rectal exam was performed, that colonoscope was advanced from the anus through the rectum and colon to the area of the cecum, ileocecal valve and appendiceal orifice. The cecum was deeply intubated. These structures were well-seen and photographed for the record. From the level of the cecum and ileocecal valve, the scope was slowly and cautiously withdrawn. The mucosal surfaces were carefully surveyed utilizing scope tip to flexion to facilitate fold flattening as needed. The scope was pulled down into the rectum where a thorough exam including retroflexion was performed.  Findings:   Prep excellent. Small polyp ablated via cold biopsy from hepatic flexure. Normal rectal mucosa. Moderate size hemorrhoids below the dentate line.  Therapeutic/Diagnostic Maneuvers Performed:  See above  Complications:  None  Cecal Withdrawal Time:  15 minutes  Impression:  Examination performed to cecum. Small polyp at hepatic flexure ablated via cold biopsy. External hemorrhoids. No lesion found on this study as well to account for patient's  IDA.  Recommendations:  Standard instructions given. Hemoccult x3. H&H will be checked today. He possibly will need small bowel given capsule study but first await for result of Hemoccults  Dedria Endres U  03/13/2012 8:11 AM  CC: Dr. Kirk Ruths, MD & Dr. Bonnetta Barry ref. provider found

## 2012-03-18 ENCOUNTER — Encounter (HOSPITAL_COMMUNITY): Payer: Self-pay | Admitting: Internal Medicine

## 2012-03-25 ENCOUNTER — Encounter (INDEPENDENT_AMBULATORY_CARE_PROVIDER_SITE_OTHER): Payer: Self-pay | Admitting: *Deleted

## 2012-03-27 ENCOUNTER — Telehealth (INDEPENDENT_AMBULATORY_CARE_PROVIDER_SITE_OTHER): Payer: Self-pay | Admitting: Internal Medicine

## 2012-03-27 NOTE — Telephone Encounter (Signed)
Returned 3 stool cards.  All 3 stool cards were negative.

## 2015-06-22 DIAGNOSIS — M1732 Unilateral post-traumatic osteoarthritis, left knee: Secondary | ICD-10-CM | POA: Diagnosis not present

## 2015-06-22 DIAGNOSIS — M1611 Unilateral primary osteoarthritis, right hip: Secondary | ICD-10-CM | POA: Diagnosis not present

## 2015-07-10 ENCOUNTER — Ambulatory Visit: Payer: Self-pay | Admitting: Orthopedic Surgery

## 2015-07-11 DIAGNOSIS — M19049 Primary osteoarthritis, unspecified hand: Secondary | ICD-10-CM | POA: Diagnosis not present

## 2015-07-12 ENCOUNTER — Ambulatory Visit: Payer: Self-pay | Admitting: Orthopedic Surgery
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
## 2015-07-12 NOTE — Patient Instructions (Addendum)
AUSTIN HERD  07/14/2015   Your procedure is scheduled on: Friday 07/21/15  Report to [facility]  Entrance take [facility]  elevators to 3rd floor to  [place] at 5:30 AM.  Call this number if you have problems the morning of surgery [phone]   Remember: ONLY 1 PERSON MAY GO WITH YOU TO SHORT STAY TO GET  READY MORNING OF [address].  Do not eat food or drink liquids :After Midnight.     Take these medicines the morning of surgery with A SIP OF WATER: Omeprazole DO NOT TAKE ANY DIABETIC MEDICATIONS DAY OF YOUR SURGERY                               You may not have any metal on your body including hair pins and              piercings  Do not wear jewelry, make-up, lotions, powders or perfumes, deodorant             Do not wear nail polish.  Do not shave  48 hours prior to surgery.              Men may shave face and neck.   Do not bring valuables to the hospital. [place].  Contacts, dentures or bridgework may not be worn into surgery.  Leave suitcase in the car. After surgery it may be brought to your room.   Special Instructions: practice deep breathing and leg exercises              Please read over the following fact sheets you were given: _____________________________________________________________________             [facility] - Preparing for Surgery Before surgery, you can play an important role.  Because skin is not sterile, your skin needs to be as free of germs as possible.  You can reduce the number of germs on your skin by washing with CHG (chlorahexidine gluconate) soap before surgery.  CHG is an antiseptic cleaner which kills germs and bonds with the skin to continue killing germs even after washing. Please DO NOT use if you have an allergy to CHG or antibacterial soaps.  If your skin becomes reddened/irritated stop using the CHG and inform your nurse when you arrive at Short  Stay. Do not shave (including legs and underarms) for at least 48 hours prior to the first CHG shower.  You may shave your face/neck. Please follow these instructions carefully:  1.  Shower with CHG Soap the night before surgery and the  morning of Surgery.  2.  If you choose to wash your hair, wash your hair first as usual with your  normal  shampoo.  3.  After you shampoo, rinse your hair and body thoroughly to remove the  shampoo.                            4.  Use CHG as you would any other liquid soap.  You can apply chg directly  to the skin and wash  Gently with a scrungie or clean washcloth.  5.  Apply the CHG Soap to your body ONLY FROM THE NECK DOWN.   Do not use on face/ open                           Wound or open sores. Avoid contact with eyes, ears mouth and genitals (private parts).                       Wash face,  Genitals (private parts) with your normal soap.             6.  Wash thoroughly, paying special attention to the area where your surgery  will be performed.  7.  Thoroughly rinse your body with warm water from the neck down.  8.  DO NOT shower/wash with your normal soap after using and rinsing off  the CHG Soap.                9.  Pat yourself dry with a clean towel.            10.  Wear clean pajamas.            11.  Place clean sheets on your bed the night of your first shower and do not  sleep with pets. Day of Surgery : Do not apply any lotions/deodorants the morning of surgery.  Please wear clean clothes to the hospital/surgery center.  FAILURE TO FOLLOW THESE INSTRUCTIONS MAY RESULT IN THE CANCELLATION OF YOUR SURGERY PATIENT SIGNATURE_________________________________  NURSE SIGNATURE__________________________________  ________________________________________________________________________  ________________________________________________________________________   Adam Phenix  An incentive spirometer is a tool that can  help keep your lungs clear and active. This tool measures how well you are filling your lungs with each breath. Taking long deep breaths may help reverse or decrease the chance of developing breathing (pulmonary) problems (especially infection) following:  A long period of time when you are unable to move or be active. BEFORE THE PROCEDURE   If the spirometer includes an indicator to show your best effort, your nurse or respiratory therapist will set it to a desired goal.  If possible, sit up straight or lean slightly forward. Try not to slouch.  Hold the incentive spirometer in an upright position. INSTRUCTIONS FOR USE   Sit on the edge of your bed if possible, or sit up as far as you can in bed or on a chair.  Hold the incentive spirometer in an upright position.  Breathe out normally.  Place the mouthpiece in your mouth and seal your lips tightly around it.  Breathe in slowly and as deeply as possible, raising the piston or the ball toward the top of the column.  Hold your breath for 3-5 seconds or for as long as possible. Allow the piston or ball to fall to the bottom of the column.  Remove the mouthpiece from your mouth and breathe out normally.  Rest for a few seconds and repeat Steps 1 through 7 at least 10 times every 1-2 hours when you are awake. Take your time and take a few normal breaths between deep breaths.  The spirometer may include an indicator to show your best effort. Use the indicator as a goal to work toward during each repetition.  After each set of 10 deep breaths, practice coughing to be sure your lungs are clear. If you have an incision (the cut made at the time  of surgery), support your incision when coughing by placing a pillow or rolled up towels firmly against it. Once you are able to get out of bed, walk around indoors and cough well. You may stop using the incentive spirometer when instructed by your caregiver.  RISKS AND COMPLICATIONS  Take your time  so you do not get dizzy or light-headed.  If you are in pain, you may need to take or ask for pain medication before doing incentive spirometry. It is harder to take a deep breath if you are having pain. AFTER USE  Rest and breathe slowly and easily.  It can be helpful to keep track of a log of your progress. Your caregiver can provide you with a simple table to help with this. If you are using the spirometer at home, follow these instructions: [place] IF:   You are having difficultly using the spirometer.  You have trouble using the spirometer as often as instructed.  Your pain medication is not giving enough relief while using the spirometer.  You develop fever of 100.5 F (38.1 C) or higher. SEEK IMMEDIATE MEDICAL CARE IF:   You cough up bloody sputum that had not been present before.  You develop fever of 102 F (38.9 C) or greater.  You develop worsening pain at or near the incision site. MAKE SURE YOU:   Understand these instructions.  Will watch your condition.  Will get help right away if you are not doing well or get worse. Document Released: 01/06/2007 Document Revised: 11/18/2011 Document Reviewed: 03/09/2007 Exit[employer] Patient Information 2014 [employer], [place].   ________________________________________________________________________  WHAT IS A BLOOD TRANSFUSION? Blood Transfusion Information  A transfusion is the replacement of blood or some of its parts. Blood is made up of multiple cells which provide different functions.  Red blood cells carry oxygen and are used for blood loss replacement.  White blood cells fight against infection.  Platelets control bleeding.  Plasma helps clot blood.  Other blood products are available for specialized needs, such as hemophilia or other clotting disorders. BEFORE THE TRANSFUSION  Who gives blood for transfusions?   Healthy volunteers who are fully evaluated to make sure their blood is safe. This is  blood bank blood. Transfusion therapy is the safest it has ever been in the practice of medicine. Before blood is taken from a donor, a complete history is taken to make sure that person has no history of diseases nor engages in risky social behavior (examples are intravenous drug use or sexual activity with multiple partners). The donor's travel history is screened to minimize risk of transmitting infections, such as malaria. The donated blood is tested for signs of infectious diseases, such as HIV and hepatitis. The blood is then tested to be sure it is compatible with you in order to minimize the chance of a transfusion reaction. If you or a relative donates blood, this is often done in anticipation of surgery and is not appropriate for emergency situations. It takes many days to process the donated blood. RISKS AND COMPLICATIONS Although transfusion therapy is very safe and saves many lives, the main dangers of transfusion include:   Getting an infectious disease.  Developing a transfusion reaction. This is an allergic reaction to something in the blood you were given. Every precaution is taken to prevent this. The decision to have a blood transfusion has been considered carefully by your caregiver before blood is given. Blood is not given unless the benefits outweigh the risks. AFTER  THE TRANSFUSION  Right after receiving a blood transfusion, you will usually feel much better and more energetic. This is especially true if your red blood cells have gotten low (anemic). The transfusion raises the level of the red blood cells which carry oxygen, and this usually causes an energy increase.  The nurse administering the transfusion will monitor you carefully for complications. HOME CARE INSTRUCTIONS  No special instructions are needed after a transfusion. You may find your energy is better. Speak with your caregiver about any limitations on activity for underlying diseases you may have. SEEK MEDICAL  CARE IF:   Your condition is not improving after your transfusion.  You develop redness or irritation at the intravenous (IV) site. SEEK IMMEDIATE MEDICAL CARE IF:  Any of the following symptoms occur over the next 12 hours:  Shaking chills.  You have a temperature by mouth above 102 F (38.9 C), not controlled by medicine.  Chest, back, or muscle pain.  People around you feel you are not acting correctly or are confused.  Shortness of breath or difficulty breathing.  Dizziness and fainting.  You get a rash or develop hives.  You have a decrease in urine output.  Your urine turns a dark color or changes to pink, red, or brown. Any of the following symptoms occur over the next 10 days:  You have a temperature by mouth above 102 F (38.9 C), not controlled by medicine.  Shortness of breath.  Weakness after normal activity.  The white part of the eye turns yellow (jaundice).  You have a decrease in the amount of urine or are urinating less often.  Your urine turns a dark color or changes to pink, red, or brown. Document Released: 08/23/2000 Document Revised: 11/18/2011 Document Reviewed: 04/11/2008 [facility] Patient Information 2014 [place], [place].  _______________________________________________________________________

## 2015-07-14 ENCOUNTER — Encounter (HOSPITAL_COMMUNITY)
Admission: RE | Admit: 2015-07-14 | Discharge: 2015-07-14 | Disposition: A | Discharge status: Home / Self Care | Payer: Medicare HMO | Source: Ambulatory Visit | Attending: Orthopedic Surgery | Admitting: Orthopedic Surgery

## 2015-07-14 ENCOUNTER — Encounter (HOSPITAL_COMMUNITY): Payer: Self-pay

## 2015-07-14 DIAGNOSIS — M1611 Unilateral primary osteoarthritis, right hip: Secondary | ICD-10-CM | POA: Diagnosis not present

## 2015-07-14 DIAGNOSIS — Z01818 Encounter for other preprocedural examination: Secondary | ICD-10-CM | POA: Diagnosis not present

## 2015-07-14 LAB — COMPREHENSIVE METABOLIC PANEL
ALBUMIN: 4 g/dL (ref 3.5–5.0)
ALK PHOS: 90 U/L (ref 38–126)
ALT: 10 U/L — AB (ref 17–63)
ANION GAP: 10 (ref 5–15)
AST: 14 U/L — ABNORMAL LOW (ref 15–41)
BILIRUBIN TOTAL: 0.6 mg/dL (ref 0.3–1.2)
BUN: 18 mg/dL (ref 6–20)
CO2: 22 mmol/L (ref 22–32)
CREATININE: 0.83 mg/dL (ref 0.61–1.24)
Calcium: 9.7 mg/dL (ref 8.9–10.3)
Chloride: 103 mmol/L (ref 101–111)
GFR calc non Af Amer: >60 mL/min (ref >60)
GLUCOSE: 100 mg/dL — AB (ref 65–99)
Potassium: 4.3 mmol/L (ref 3.5–5.1)
Sodium: 135 mmol/L (ref 135–145)
Total Protein: 7.2 g/dL (ref 6.5–8.1)

## 2015-07-14 LAB — APTT: aPTT: 31 seconds (ref 24–37)

## 2015-07-14 LAB — URINALYSIS, ROUTINE W REFLEX MICROSCOPIC
Glucose, UA: NEGATIVE mg/dL
HGB URINE DIPSTICK: NEGATIVE
Ketones, ur: NEGATIVE mg/dL
Leukocytes, UA: NEGATIVE
Nitrite: NEGATIVE
PH: 5 (ref 5.0–8.0)
Protein, ur: NEGATIVE mg/dL
SPECIFIC GRAVITY, URINE: 1.025 (ref 1.005–1.030)
UROBILINOGEN UA: 1 mg/dL (ref 0.0–1.0)

## 2015-07-14 LAB — CBC
HEMATOCRIT: 36.7 % — AB (ref 39.0–52.0)
HEMOGLOBIN: 11.7 g/dL — AB (ref 13.0–17.0)
MCH: 27 pg (ref 26.0–34.0)
MCHC: 31.9 g/dL (ref 30.0–36.0)
MCV: 84.8 fL (ref 78.0–100.0)
Platelets: 272 10*3/uL (ref 150–400)
RBC: 4.33 MIL/uL (ref 4.22–5.81)
RDW: 13.9 % (ref 11.5–15.5)
WBC: 8.4 10*3/uL (ref 4.0–10.5)

## 2015-07-14 LAB — SURGICAL PCR SCREEN
MRSA, PCR: NEGATIVE
STAPHYLOCOCCUS AUREUS: NEGATIVE

## 2015-07-14 LAB — PROTIME-INR
INR: 1.04 (ref 0.00–1.49)
PROTHROMBIN TIME: 13.8 s (ref 11.6–15.2)

## 2015-07-14 LAB — ABO/RH: ABO/RH(D): AB POS

## 2015-07-14 NOTE — Pre-Procedure Instructions (Addendum)
Surgery clearance Dr. Hortencia Conradi on chart EKG 07-04-15 on chart Pt c/o swelling in both hands and one foot for weeks now.  Notified Dr. Oletta Lamas in anesthesia. She is planning to discuss this with Dr. Lyla Glassing and get back to me.   Dr. Oletta Lamas has visited with pt and has given pt further instructions.  Dr. Oletta Lamas states she will discuss this with Dr. Lyla Glassing.  Routed UA results via fax to Dr. Lyla Glassing.

## 2015-07-17 ENCOUNTER — Ambulatory Visit (INDEPENDENT_AMBULATORY_CARE_PROVIDER_SITE_OTHER): Payer: Medicare HMO | Admitting: Cardiology

## 2015-07-17 ENCOUNTER — Encounter: Payer: Self-pay | Admitting: Cardiology

## 2015-07-17 VITALS — BP 126/78 | HR 106 | Ht 70.5 in | Wt 187.0 lb

## 2015-07-17 DIAGNOSIS — I10 Essential (primary) hypertension: Secondary | ICD-10-CM

## 2015-07-17 DIAGNOSIS — Z01810 Encounter for preprocedural cardiovascular examination: Secondary | ICD-10-CM

## 2015-07-17 NOTE — Patient Instructions (Signed)
Your physician recommends that you schedule a follow-up appointment in: as needed   Thank you for choosing [facility] !         
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
## 2015-07-20 NOTE — Progress Notes (Signed)
Cardiology clearance note dr branch on chart for 07-21-15 surgery

## 2015-07-20 NOTE — Anesthesia Preprocedure Evaluation (Addendum)
Anesthesia Evaluation  Patient identified by MRN, date of birth, ID band Patient awake    Reviewed: Allergy & Precautions, NPO status , Patient's Chart, lab work & pertinent test results  Airway Mallampati: II  TM Distance: >3 FB Neck ROM: Full    Dental no notable dental hx.    Pulmonary former smoker,    Pulmonary exam normal breath sounds clear to auscultation       Cardiovascular hypertension, Pt. on medications Normal cardiovascular exam Rhythm:Regular Rate:Normal     Neuro/Psych negative neurological ROS  negative psych ROS   GI/Hepatic Neg liver ROS, GERD  ,  Endo/Other  negative endocrine ROS  Renal/GU negative Renal ROS     Musculoskeletal negative musculoskeletal ROS (+) Arthritis ,   Abdominal   Peds  Hematology negative hematology ROS (+) anemia ,   Anesthesia Other Findings   Reproductive/Obstetrics negative OB ROS                           Anesthesia Physical Anesthesia Plan  ASA: II  Anesthesia Plan: General   Post-op Pain Management:    Induction: Intravenous  Airway Management Planned: Oral ETT  Additional Equipment:   Intra-op Plan:   Post-operative Plan: Extubation in OR  Informed Consent: I have reviewed the patients History and Physical, chart, labs and discussed the procedure including the risks, benefits and alternatives for the proposed anesthesia with the patient or authorized representative who has indicated his/her understanding and acceptance.   Dental advisory given  Plan Discussed with: CRNA  Anesthesia Plan Comments:        Anesthesia Quick Evaluation

## 2015-07-21 ENCOUNTER — Inpatient Hospital Stay (HOSPITAL_COMMUNITY): Payer: Medicare HMO | Admitting: Anesthesiology

## 2015-07-21 ENCOUNTER — Inpatient Hospital Stay (HOSPITAL_COMMUNITY): Payer: Medicare HMO

## 2015-07-21 ENCOUNTER — Encounter (HOSPITAL_COMMUNITY)
Admission: RE | Disposition: A | Discharge status: Home Health | Payer: Self-pay | Source: Ambulatory Visit | Attending: Orthopedic Surgery

## 2015-07-21 ENCOUNTER — Inpatient Hospital Stay (HOSPITAL_COMMUNITY)
Admission: RE | Admit: 2015-07-21 | Discharge: 2015-07-23 | DRG: 470 | Disposition: A | Discharge status: Home Health | Payer: Medicare HMO | Source: Ambulatory Visit | Attending: Orthopedic Surgery | Admitting: Orthopedic Surgery

## 2015-07-21 ENCOUNTER — Encounter (HOSPITAL_COMMUNITY): Payer: Self-pay | Admitting: *Deleted

## 2015-07-21 DIAGNOSIS — D62 Acute posthemorrhagic anemia: Secondary | ICD-10-CM | POA: Diagnosis not present

## 2015-07-21 DIAGNOSIS — M1611 Unilateral primary osteoarthritis, right hip: Secondary | ICD-10-CM | POA: Diagnosis present

## 2015-07-21 DIAGNOSIS — I10 Essential (primary) hypertension: Secondary | ICD-10-CM | POA: Diagnosis present

## 2015-07-21 DIAGNOSIS — E7800 Pure hypercholesterolemia, unspecified: Secondary | ICD-10-CM | POA: Diagnosis present

## 2015-07-21 DIAGNOSIS — Z09 Encounter for follow-up examination after completed treatment for conditions other than malignant neoplasm: Secondary | ICD-10-CM

## 2015-07-21 DIAGNOSIS — Z96641 Presence of right artificial hip joint: Secondary | ICD-10-CM | POA: Diagnosis not present

## 2015-07-21 DIAGNOSIS — Z01812 Encounter for preprocedural laboratory examination: Secondary | ICD-10-CM | POA: Diagnosis not present

## 2015-07-21 DIAGNOSIS — Z87891 Personal history of nicotine dependence: Secondary | ICD-10-CM | POA: Diagnosis not present

## 2015-07-21 DIAGNOSIS — M25551 Pain in right hip: Secondary | ICD-10-CM | POA: Diagnosis present

## 2015-07-21 DIAGNOSIS — M169 Osteoarthritis of hip, unspecified: Secondary | ICD-10-CM | POA: Diagnosis not present

## 2015-07-21 DIAGNOSIS — K219 Gastro-esophageal reflux disease without esophagitis: Secondary | ICD-10-CM | POA: Diagnosis not present

## 2015-07-21 DIAGNOSIS — Z471 Aftercare following joint replacement surgery: Secondary | ICD-10-CM | POA: Diagnosis not present

## 2015-07-21 HISTORY — PX: TOTAL HIP ARTHROPLASTY: SHX124

## 2015-07-21 LAB — TYPE AND SCREEN
ABO/RH(D): AB POS
ANTIBODY SCREEN: NEGATIVE

## 2015-07-21 SURGERY — ARTHROPLASTY, HIP, TOTAL, ANTERIOR APPROACH
Anesthesia: General | Site: Hip | Laterality: Right

## 2015-07-21 MED ORDER — LIDOCAINE HCL (CARDIAC) 20 MG/ML IV SOLN
INTRAVENOUS | Status: DC | PRN
  Administered 2015-07-21: 40 mg via INTRAVENOUS

## 2015-07-21 MED ORDER — SODIUM CHLORIDE 0.9 % IR SOLN
Status: DC | PRN
  Administered 2015-07-21: 3000 mL

## 2015-07-21 MED ORDER — SUGAMMADEX SODIUM 200 MG/2ML IV SOLN
INTRAVENOUS | Status: AC
  Filled 2015-07-21: qty 2

## 2015-07-21 MED ORDER — LABETALOL HCL 5 MG/ML IV SOLN
INTRAVENOUS | Status: DC | PRN
  Administered 2015-07-21: 5 mg via INTRAVENOUS

## 2015-07-21 MED ORDER — MENTHOL 3 MG MT LOZG
1.0000 | LOZENGE | OROMUCOSAL | Status: DC | PRN
Start: 2015-07-21 — End: 2015-07-23

## 2015-07-21 MED ORDER — MIDAZOLAM HCL 5 MG/5ML IJ SOLN
INTRAMUSCULAR | Status: DC | PRN
  Administered 2015-07-21: 2 mg via INTRAVENOUS

## 2015-07-21 MED ORDER — ISOPROPYL ALCOHOL 70 % SOLN
Status: DC | PRN
  Administered 2015-07-21: 1 via TOPICAL

## 2015-07-21 MED ORDER — METOCLOPRAMIDE HCL 10 MG PO TABS: 5.0000 mg | ORAL_TABLET | Freq: Three times a day (TID) | ORAL | Status: DC | PRN

## 2015-07-21 MED ORDER — ROSUVASTATIN CALCIUM 20 MG PO TABS
20.0000 mg | ORAL_TABLET | Freq: Every day | ORAL | Status: DC
  Administered 2015-07-21: 20 mg via ORAL
  Filled 2015-07-21 (×3): qty 1

## 2015-07-21 MED ORDER — ONDANSETRON HCL 4 MG PO TABS: 4.0000 mg | ORAL_TABLET | Freq: Four times a day (QID) | ORAL | Status: DC | PRN

## 2015-07-21 MED ORDER — FENTANYL CITRATE (PF) 100 MCG/2ML IJ SOLN
INTRAMUSCULAR | Status: AC
  Filled 2015-07-21: qty 4

## 2015-07-21 MED ORDER — OXYCODONE-ACETAMINOPHEN 5-325 MG PO TABS: 1.0000 | ORAL_TABLET | ORAL | Status: DC | PRN

## 2015-07-21 MED ORDER — ACETAMINOPHEN 650 MG RE SUPP
650.0000 mg | Freq: Four times a day (QID) | RECTAL | Status: DC | PRN
Start: 2015-07-21 — End: 2015-07-23

## 2015-07-21 MED ORDER — CHLORHEXIDINE GLUCONATE 4 % EX LIQD: 60.0000 mL | Freq: Once | CUTANEOUS | Status: DC

## 2015-07-21 MED ORDER — PROMETHAZINE HCL 25 MG/ML IJ SOLN: 6.2500 mg | INTRAMUSCULAR | Status: DC | PRN

## 2015-07-21 MED ORDER — FENTANYL CITRATE (PF) 250 MCG/5ML IJ SOLN
INTRAMUSCULAR | Status: AC
  Filled 2015-07-21: qty 25

## 2015-07-21 MED ORDER — LABETALOL HCL 5 MG/ML IV SOLN
INTRAVENOUS | Status: AC
  Filled 2015-07-21: qty 4

## 2015-07-21 MED ORDER — BUPIVACAINE-EPINEPHRINE (PF) 0.25% -1:200000 IJ SOLN
INTRAMUSCULAR | Status: AC
  Filled 2015-07-21: qty 30

## 2015-07-21 MED ORDER — HYDROMORPHONE HCL 1 MG/ML IJ SOLN
INTRAMUSCULAR | Status: AC
  Filled 2015-07-21: qty 1

## 2015-07-21 MED ORDER — LIDOCAINE HCL (CARDIAC) 20 MG/ML IV SOLN
INTRAVENOUS | Status: AC
  Filled 2015-07-21: qty 5

## 2015-07-21 MED ORDER — CEFAZOLIN SODIUM-DEXTROSE 2-3 GM-% IV SOLR
INTRAVENOUS | Status: AC
  Filled 2015-07-21: qty 50

## 2015-07-21 MED ORDER — HYDROMORPHONE HCL 1 MG/ML IJ SOLN: 0.2500 mg | INTRAMUSCULAR | Status: DC | PRN

## 2015-07-21 MED ORDER — MIDAZOLAM HCL 2 MG/2ML IJ SOLN
INTRAMUSCULAR | Status: AC
  Filled 2015-07-21: qty 4

## 2015-07-21 MED ORDER — SODIUM CHLORIDE 0.9 % IV SOLN
INTRAVENOUS | Status: DC
  Administered 2015-07-21 (×2): via INTRAVENOUS

## 2015-07-21 MED ORDER — SODIUM CHLORIDE 0.9 % IV SOLN: INTRAVENOUS | Status: DC

## 2015-07-21 MED ORDER — ASPIRIN EC 325 MG PO TBEC
325.0000 mg | DELAYED_RELEASE_TABLET | Freq: Two times a day (BID) | ORAL | Status: DC
  Administered 2015-07-22 – 2015-07-23 (×3): 325 mg via ORAL
  Filled 2015-07-21 (×5): qty 1

## 2015-07-21 MED ORDER — PANTOPRAZOLE SODIUM 40 MG PO TBEC
80.0000 mg | DELAYED_RELEASE_TABLET | Freq: Every day | ORAL | Status: DC
  Administered 2015-07-21 – 2015-07-23 (×3): 80 mg via ORAL
  Filled 2015-07-21 (×3): qty 2

## 2015-07-21 MED ORDER — LISINOPRIL 10 MG PO TABS
10.0000 mg | ORAL_TABLET | Freq: Every day | ORAL | Status: DC
  Administered 2015-07-21 – 2015-07-23 (×3): 10 mg via ORAL
  Filled 2015-07-21 (×3): qty 1

## 2015-07-21 MED ORDER — HYDRALAZINE HCL 20 MG/ML IJ SOLN
INTRAMUSCULAR | Status: AC
  Filled 2015-07-21: qty 1

## 2015-07-21 MED ORDER — ACETAMINOPHEN 10 MG/ML IV SOLN
1000.0000 mg | Freq: Once | INTRAVENOUS | Status: AC
Start: 2015-07-21 — End: 2015-07-21
  Administered 2015-07-21: 1000 mg via INTRAVENOUS

## 2015-07-21 MED ORDER — DEXAMETHASONE SODIUM PHOSPHATE 10 MG/ML IJ SOLN
10.0000 mg | Freq: Once | INTRAMUSCULAR | Status: AC
  Administered 2015-07-22: 10 mg via INTRAVENOUS
  Filled 2015-07-21: qty 1

## 2015-07-21 MED ORDER — DEXTROSE 5 % IV SOLN
500.0000 mg | Freq: Four times a day (QID) | INTRAVENOUS | Status: DC | PRN
  Administered 2015-07-21: 500 mg via INTRAVENOUS
  Filled 2015-07-21 (×2): qty 5

## 2015-07-21 MED ORDER — ACETAMINOPHEN 10 MG/ML IV SOLN
INTRAVENOUS | Status: AC
  Filled 2015-07-21: qty 100

## 2015-07-21 MED ORDER — ONDANSETRON 8 MG PO TBDP
8.0000 mg | ORAL_TABLET | Freq: Three times a day (TID) | ORAL | Status: DC | PRN
Start: 2015-07-21 — End: 2018-04-16

## 2015-07-21 MED ORDER — EPHEDRINE SULFATE 50 MG/ML IJ SOLN
INTRAMUSCULAR | Status: AC
  Filled 2015-07-21: qty 1

## 2015-07-21 MED ORDER — PROPOFOL 10 MG/ML IV BOLUS
INTRAVENOUS | Status: DC | PRN
  Administered 2015-07-21: 150 mg via INTRAVENOUS

## 2015-07-21 MED ORDER — ONDANSETRON HCL 4 MG/2ML IJ SOLN
INTRAMUSCULAR | Status: AC
  Filled 2015-07-21: qty 2

## 2015-07-21 MED ORDER — SUGAMMADEX SODIUM 200 MG/2ML IV SOLN
INTRAVENOUS | Status: DC | PRN
  Administered 2015-07-21: 200 mg via INTRAVENOUS

## 2015-07-21 MED ORDER — OXYCODONE HCL 5 MG PO TABS
5.0000 mg | ORAL_TABLET | ORAL | Status: DC | PRN
  Administered 2015-07-22 – 2015-07-23 (×6): 10 mg via ORAL
  Filled 2015-07-21 (×6): qty 2

## 2015-07-21 MED ORDER — MEPERIDINE HCL 50 MG/ML IJ SOLN: 6.2500 mg | INTRAMUSCULAR | Status: DC | PRN

## 2015-07-21 MED ORDER — KETOROLAC TROMETHAMINE 30 MG/ML IJ SOLN
INTRAMUSCULAR | Status: DC | PRN
  Administered 2015-07-21: 30 mg

## 2015-07-21 MED ORDER — CEFAZOLIN SODIUM-DEXTROSE 2-3 GM-% IV SOLR
2.0000 g | INTRAVENOUS | Status: AC
  Administered 2015-07-21: 2 g via INTRAVENOUS

## 2015-07-21 MED ORDER — KETOROLAC TROMETHAMINE 30 MG/ML IJ SOLN
INTRAMUSCULAR | Status: AC
  Filled 2015-07-21: qty 1

## 2015-07-21 MED ORDER — DEXAMETHASONE SODIUM PHOSPHATE 10 MG/ML IJ SOLN
INTRAMUSCULAR | Status: AC
  Filled 2015-07-21: qty 1

## 2015-07-21 MED ORDER — ASPIRIN EC 325 MG PO TBEC: 325.0000 mg | DELAYED_RELEASE_TABLET | Freq: Two times a day (BID) | ORAL | Status: DC

## 2015-07-21 MED ORDER — SENNA 8.6 MG PO TABS
2.0000 | ORAL_TABLET | Freq: Every day | ORAL | Status: DC
  Administered 2015-07-21: 17.2 mg via ORAL

## 2015-07-21 MED ORDER — LACTATED RINGERS IV SOLN
INTRAVENOUS | Status: DC | PRN
  Administered 2015-07-21 (×2): via INTRAVENOUS

## 2015-07-21 MED ORDER — KETOROLAC TROMETHAMINE 15 MG/ML IJ SOLN
7.5000 mg | Freq: Four times a day (QID) | INTRAMUSCULAR | Status: AC
  Administered 2015-07-21 – 2015-07-22 (×3): 7.5 mg via INTRAVENOUS
  Filled 2015-07-21 (×4): qty 1

## 2015-07-21 MED ORDER — HYDROMORPHONE HCL 1 MG/ML IJ SOLN
0.5000 mg | INTRAMUSCULAR | Status: DC | PRN
  Administered 2015-07-21 (×2): 0.5 mg via INTRAVENOUS
  Filled 2015-07-21 (×2): qty 1

## 2015-07-21 MED ORDER — SODIUM CHLORIDE 0.9 % IJ SOLN
INTRAMUSCULAR | Status: AC
  Filled 2015-07-21: qty 50

## 2015-07-21 MED ORDER — FENTANYL CITRATE (PF) 100 MCG/2ML IJ SOLN
INTRAMUSCULAR | Status: DC | PRN
  Administered 2015-07-21: 50 ug via INTRAVENOUS
  Administered 2015-07-21 (×3): 100 ug via INTRAVENOUS

## 2015-07-21 MED ORDER — WATER FOR IRRIGATION, STERILE IR SOLN
Status: DC | PRN
  Administered 2015-07-21: 1000 mL

## 2015-07-21 MED ORDER — METHOCARBAMOL 500 MG PO TABS: 500.0000 mg | ORAL_TABLET | Freq: Four times a day (QID) | ORAL | Status: DC | PRN

## 2015-07-21 MED ORDER — HYDROGEN PEROXIDE 3 % EX SOLN
CUTANEOUS | Status: AC
  Filled 2015-07-21: qty 473

## 2015-07-21 MED ORDER — METOCLOPRAMIDE HCL 5 MG/ML IJ SOLN: 5.0000 mg | Freq: Three times a day (TID) | INTRAMUSCULAR | Status: DC | PRN

## 2015-07-21 MED ORDER — ROCURONIUM BROMIDE 100 MG/10ML IV SOLN
INTRAVENOUS | Status: AC
  Filled 2015-07-21: qty 1

## 2015-07-21 MED ORDER — TRANEXAMIC ACID 1000 MG/10ML IV SOLN
1000.0000 mg | INTRAVENOUS | Status: AC
  Administered 2015-07-21: 1000 mg via INTRAVENOUS
  Filled 2015-07-21: qty 10

## 2015-07-21 MED ORDER — ACETAMINOPHEN 325 MG PO TABS: 650.0000 mg | ORAL_TABLET | Freq: Four times a day (QID) | ORAL | Status: DC | PRN

## 2015-07-21 MED ORDER — ONDANSETRON HCL 4 MG/2ML IJ SOLN: 4.0000 mg | Freq: Four times a day (QID) | INTRAMUSCULAR | Status: DC | PRN

## 2015-07-21 MED ORDER — HYDROMORPHONE HCL 1 MG/ML IJ SOLN
INTRAMUSCULAR | Status: DC | PRN
  Administered 2015-07-21 (×4): 0.5 mg via INTRAVENOUS
  Administered 2015-07-21: 1 mg via INTRAVENOUS
  Administered 2015-07-21 (×2): 0.5 mg via INTRAVENOUS

## 2015-07-21 MED ORDER — ONDANSETRON HCL 4 MG/2ML IJ SOLN
INTRAMUSCULAR | Status: DC | PRN
  Administered 2015-07-21: 4 mg via INTRAVENOUS

## 2015-07-21 MED ORDER — CEFAZOLIN SODIUM-DEXTROSE 2-3 GM-% IV SOLR
2.0000 g | Freq: Four times a day (QID) | INTRAVENOUS | Status: AC
  Administered 2015-07-21 (×2): 2 g via INTRAVENOUS
  Filled 2015-07-21 (×2): qty 50

## 2015-07-21 MED ORDER — SODIUM CHLORIDE 0.9 % IJ SOLN
INTRAMUSCULAR | Status: DC | PRN
  Administered 2015-07-21: 30 mL

## 2015-07-21 MED ORDER — PROPOFOL 10 MG/ML IV BOLUS
INTRAVENOUS | Status: AC
  Filled 2015-07-21: qty 20

## 2015-07-21 MED ORDER — SODIUM CHLORIDE 0.9 % IR SOLN
Status: DC | PRN
  Administered 2015-07-21: 1000 mL

## 2015-07-21 MED ORDER — HYDROMORPHONE HCL 2 MG/ML IJ SOLN
INTRAMUSCULAR | Status: AC
  Filled 2015-07-21: qty 1

## 2015-07-21 MED ORDER — ROCURONIUM BROMIDE 100 MG/10ML IV SOLN
INTRAVENOUS | Status: DC | PRN
  Administered 2015-07-21: 50 mg via INTRAVENOUS
  Administered 2015-07-21 (×3): 10 mg via INTRAVENOUS

## 2015-07-21 MED ORDER — PHENOL 1.4 % MT LIQD
1.0000 | OROMUCOSAL | Status: DC | PRN
  Filled 2015-07-21: qty 177

## 2015-07-21 MED ORDER — DEXAMETHASONE SODIUM PHOSPHATE 10 MG/ML IJ SOLN
INTRAMUSCULAR | Status: DC | PRN
  Administered 2015-07-21: 10 mg via INTRAVENOUS

## 2015-07-21 MED ORDER — MELOXICAM 15 MG PO TABS: 15.0000 mg | ORAL_TABLET | Freq: Every day | ORAL | Status: DC

## 2015-07-21 MED ORDER — SODIUM CHLORIDE 0.9 % IJ SOLN
INTRAMUSCULAR | Status: AC
  Filled 2015-07-21: qty 10

## 2015-07-21 MED ORDER — HYDRALAZINE HCL 20 MG/ML IJ SOLN
INTRAMUSCULAR | Status: DC | PRN
  Administered 2015-07-21: 4 mg via INTRAVENOUS

## 2015-07-21 MED ORDER — ISOPROPYL ALCOHOL 70 % SOLN
Status: AC
  Filled 2015-07-21: qty 480

## 2015-07-21 MED ORDER — SENNA 8.6 MG PO TABS: 2.0000 | ORAL_TABLET | Freq: Every day | ORAL | Status: DC

## 2015-07-21 MED ORDER — DOCUSATE SODIUM 100 MG PO CAPS: 100.0000 mg | ORAL_CAPSULE | Freq: Two times a day (BID) | ORAL | Status: DC

## 2015-07-21 MED ORDER — DOCUSATE SODIUM 100 MG PO CAPS
100.0000 mg | ORAL_CAPSULE | Freq: Two times a day (BID) | ORAL | Status: DC
  Administered 2015-07-21 – 2015-07-23 (×3): 100 mg via ORAL

## 2015-07-21 SURGICAL SUPPLY — 45 items
BAG DECANTER FOR FLEXI CONT (MISCELLANEOUS) IMPLANT
BAG SPEC THK2 15X12 ZIP CLS (MISCELLANEOUS)
BAG ZIPLOCK 12X15 (MISCELLANEOUS) IMPLANT
CAPT HIP TOTAL 2 ×1 IMPLANT
CHLORAPREP W/TINT 26ML (MISCELLANEOUS) ×2 IMPLANT
COVER PERINEAL POST (MISCELLANEOUS) ×2 IMPLANT
DECANTER SPIKE VIAL GLASS SM (MISCELLANEOUS) ×2 IMPLANT
DRAPE LG THREE QUARTER DISP (DRAPES) ×4 IMPLANT
DRAPE STERI IOBAN 125X83 (DRAPES) ×2 IMPLANT
DRAPE U-SHAPE 47X51 STRL (DRAPES) ×4 IMPLANT
DRSG AQUACEL AG ADV 3.5X10 (GAUZE/BANDAGES/DRESSINGS) ×2 IMPLANT
ELECT REM PT RETURN 15FT ADLT (MISCELLANEOUS) ×2 IMPLANT
GAUZE SPONGE 4X4 12PLY STRL (GAUZE/BANDAGES/DRESSINGS) ×2 IMPLANT
GLOVE BIO SURGEON STRL SZ8.5 (GLOVE) ×4 IMPLANT
GLOVE BIOGEL PI IND STRL 6.5 (GLOVE) IMPLANT
GLOVE BIOGEL PI IND STRL 8.5 (GLOVE) ×1 IMPLANT
GLOVE BIOGEL PI INDICATOR 6.5 (GLOVE) ×1
GLOVE BIOGEL PI INDICATOR 8.5 (GLOVE) ×1
GLOVE SURG SS PI 7.0 STRL IVOR (GLOVE) ×1 IMPLANT
GOWN SPEC L3 XXLG W/TWL (GOWN DISPOSABLE) ×2 IMPLANT
HANDPIECE INTERPULSE COAX TIP (DISPOSABLE) ×2
HOLDER FOLEY CATH W/STRAP (MISCELLANEOUS) ×1 IMPLANT
HOOD PEEL AWAY FACE SHEILD DIS (HOOD) ×4 IMPLANT
LIQUID BAND (GAUZE/BANDAGES/DRESSINGS) ×4 IMPLANT
NDL SPNL 18GX3.5 QUINCKE PK (NEEDLE) ×1 IMPLANT
NEEDLE SPNL 18GX3.5 QUINCKE PK (NEEDLE) ×2 IMPLANT
PACK ANTERIOR HIP CUSTOM (KITS) ×2 IMPLANT
SAW OSC TIP CART 19.5X105X1.3 (SAW) ×2 IMPLANT
SEALER BIPOLAR AQUA 6.0 (INSTRUMENTS) ×2 IMPLANT
SET HNDPC FAN SPRY TIP SCT (DISPOSABLE) ×1 IMPLANT
SLEEVE SURGEON STRL (DRAPES) ×1 IMPLANT
SOL PREP POV-IOD 4OZ 10% (MISCELLANEOUS) ×2 IMPLANT
SUT ETHIBOND NAB CT1 #1 30IN (SUTURE) ×5 IMPLANT
SUT MNCRL AB 3-0 PS2 18 (SUTURE) ×2 IMPLANT
SUT MON AB 2-0 CT1 36 (SUTURE) ×4 IMPLANT
SUT VIC AB 1 CT1 36 (SUTURE) ×2 IMPLANT
SUT VIC AB 2-0 CT1 27 (SUTURE) ×2
SUT VIC AB 2-0 CT1 TAPERPNT 27 (SUTURE) ×1 IMPLANT
SUT VLOC 180 0 24IN GS25 (SUTURE) ×2 IMPLANT
SYR 50ML LL SCALE MARK (SYRINGE) ×1 IMPLANT
TRAY CATH 16FR W/PLASTIC CATH (SET/KITS/TRAYS/PACK) ×1 IMPLANT
TRAY FOLEY W/METER SILVER 14FR (SET/KITS/TRAYS/PACK) ×1 IMPLANT
TRAY FOLEY W/METER SILVER 16FR (SET/KITS/TRAYS/PACK) ×1 IMPLANT
WATER STERILE IRR 1500ML POUR (IV SOLUTION) ×2 IMPLANT
YANKAUER SUCT BULB TIP 10FT TU (MISCELLANEOUS) ×2 IMPLANT

## 2015-07-21 NOTE — Anesthesia Postprocedure Evaluation (Signed)
Anesthesia Post Note  Patient: [place]  Procedure(s) Performed: Procedure(s) (LRB): RIGHT TOTAL HIP ARTHROPLASTY ANTERIOR APPROACH (Right)  Anesthesia type: General  Patient location: PACU  Post pain: Pain level controlled  Post assessment: Post-op Vital signs reviewed  Last Vitals: BP 128/76 mmHg  Pulse 85  Temp(Src) 36.5 C (Oral)  Resp 18  Ht 5' 10.5" (1.791 m)  Wt 187 lb (84.823 kg)  BMI 26.44 kg/m2  SpO2 97%  Post vital signs: Reviewed  Level of consciousness: sedated  Complications: No apparent anesthesia complications

## 2015-07-21 NOTE — Transfer of Care (Signed)
Immediate Anesthesia Transfer of Care Note  Patient: [place]gins  Procedure(s) Performed: Procedure(s): RIGHT TOTAL HIP ARTHROPLASTY ANTERIOR APPROACH (Right)  Patient Location: PACU  Anesthesia Type:General  Level of Consciousness: awake, alert  and oriented  Airway & Oxygen Therapy: Patient Spontanous Breathing and Patient connected to face mask oxygen  Post-op Assessment: Report given to RN and Post -op Vital signs reviewed and stable  Post vital signs: Reviewed and stable  Last Vitals:  Filed Vitals:   07/21/15 0530  BP: 153/89  Pulse: 75  Temp: 36.7 C  Resp: 18    Complications: No apparent anesthesia complications

## 2015-07-21 NOTE — Anesthesia Procedure Notes (Signed)
Procedure Name: Intubation Date/Time: 07/21/2015 7:27 AM Performed by: Glory Buff Pre-anesthesia Checklist: Patient identified, Emergency Drugs available, Suction available and Patient being monitored Patient Re-evaluated:Patient Re-evaluated prior to inductionOxygen Delivery Method: Circle System Utilized Preoxygenation: Pre-oxygenation with 100% oxygen Intubation Type: IV induction Ventilation: Mask ventilation without difficulty Laryngoscope Size: Miller and 3 Grade View: Grade I Tube type: Oral Tube size: 7.5 mm Number of attempts: 1 Airway Equipment and Method: Stylet and Oral airway Placement Confirmation: ETT inserted through vocal cords under direct vision,  positive ETCO2 and breath sounds checked- equal and bilateral Secured at: 21 cm Tube secured with: Tape Dental Injury: Teeth and Oropharynx as per pre-operative assessment

## 2015-07-21 NOTE — Interval H&P Note (Signed)
History and Physical Interval Note:  07/21/2015 6:54 AM  [place]  has presented today for surgery, with the diagnosis of RIGHT HIP OSTEOARTHRITIS  The various methods of treatment have been discussed with the patient and family. After consideration of risks, benefits and other options for treatment, the patient has consented to  Procedure(s): RIGHT TOTAL HIP ARTHROPLASTY ANTERIOR APPROACH (Right) as a surgical intervention .  The patient's history has been reviewed, patient examined, no change in status, stable for surgery.  I have reviewed the patient's chart and labs.  Questions were answered to the patient's satisfaction.     Kathleena Freeman, Horald Pollen

## 2015-07-21 NOTE — Evaluation (Signed)
Physical Therapy Evaluation Patient Details Name: [place] MRN: [id number] DOB: 1946/06/18 Today's Date: 07/21/2015   History of Present Illness  R THR  Clinical Impression  Pt s/p R THR presents with decreased R LE strength/ROM and post op pain limiting functional mobility.  Pt should progress well to dc home with family assist and HHPT follow up.    Follow Up Recommendations Home health PT    Equipment Recommendations  None recommended by PT    Recommendations for Other Services OT consult     Precautions / Restrictions Precautions Precautions: Fall Restrictions Weight Bearing Restrictions: No Other Position/Activity Restrictions: WBAT      Mobility  Bed Mobility Overal bed mobility: Needs Assistance Bed Mobility: Supine to Sit     Supine to sit: Min assist     General bed mobility comments: cues for sequence and use of L LE to self assist  Transfers Overall transfer level: Needs assistance Equipment used: Rolling walker (2 wheeled) Transfers: Sit to/from Stand Sit to Stand: Min assist         General transfer comment: cues for LE management and use of UEs to self assist  Ambulation/Gait Ambulation/Gait assistance: Min assist Ambulation Distance (Feet): 200 Feet Assistive device: Rolling walker (2 wheeled) Gait Pattern/deviations: Step-to pattern;Step-through pattern;Decreased step length - right;Decreased step length - left;Shuffle;Trunk flexed     General Gait Details: cues for posture, position from RW and initial sequence  Stairs            Wheelchair Mobility    Modified Rankin (Stroke Patients Only)       Balance                                             Pertinent Vitals/Pain Pain Assessment: 0-10 Pain Score: 2  Pain Location: R hip Pain Descriptors / Indicators: Sore Pain Intervention(s): Limited activity within patient's tolerance;Monitored during session;Premedicated before session;Ice applied     Home Living Family/patient expects to be discharged to:: Private residence Living Arrangements: Spouse/significant other Available Help at Discharge: Family Type of Home: House Home Access: [place]: [place]: Environmental consultant - 2 wheels;Walker - 4 wheels      Prior Function Level of Independence: Independent               Hand Dominance        Extremity/Trunk Assessment   Upper Extremity Assessment: Overall WFL for tasks assessed           Lower Extremity Assessment: RLE deficits/detail      Cervical / Trunk Assessment: Normal  Communication   Communication: No difficulties  Cognition Arousal/Alertness: Awake/alert Behavior During Therapy: WFL for tasks assessed/performed Overall Cognitive Status: Within Functional Limits for tasks assessed                      General Comments      Exercises Total Joint Exercises Ankle Circles/Pumps: AROM;Both;15 reps;Supine      Assessment/Plan    PT Assessment Patient needs continued PT services  PT Diagnosis Difficulty walking   PT Problem List Decreased strength;Decreased range of motion;Decreased activity tolerance;Decreased mobility;Decreased knowledge of use of DME;Decreased knowledge of precautions;Pain  PT Treatment Interventions DME instruction;Gait training;Stair training;Functional mobility training;Therapeutic activities;Therapeutic exercise;Patient/family education   PT Goals (Current goals can be found in the Care Plan  section) Acute Rehab PT Goals Patient Stated Goal: Resume previous lifestyle with decreased pain PT Goal Formulation: With patient Time For Goal Achievement: 07/28/15 Potential to Achieve Goals: Good    Frequency 7X/week   Barriers to discharge        Co-evaluation               End of Session Equipment Utilized During Treatment: Gait belt Activity Tolerance: Patient tolerated treatment well Patient left: in chair;with call  bell/phone within reach;with chair alarm set Nurse Communication: Mobility status         Time: [id number] PT Time Calculation (min) (ACUTE ONLY): 23 min   Charges:   PT Evaluation $Initial PT Evaluation Tier I: 1 Procedure PT Treatments $Gait Training: 8-22 mins   PT G Codes:        [place] Kyler 08/13/2015, 5:07 PM

## 2015-07-21 NOTE — Progress Notes (Signed)
Utilization review completed.  

## 2015-07-21 NOTE — Op Note (Signed)
OPERATIVE REPORT  SURGEON: Rod Can, MD   ASSISTANT: Roberto Scales, RNFA.  PREOPERATIVE DIAGNOSIS: Right hip arthritis.   POSTOPERATIVE DIAGNOSIS: Right hip arthritis.   PROCEDURE: Right total hip arthroplasty, anterior approach.   IMPLANTS: DePuy Tri Lock stem, size 7, hi offset. DePuy Pinnacle Cup, size 60 mm. DePuy Altrx liner, size 60 by 36 mm, neutral. DePuy metal head ball, size 36 + -2 mm.  ANESTHESIA:  General  ESTIMATED BLOOD LOSS: 550 mL.  ANTIBIOTICS: 2g ancef.  DRAINS: None.  COMPLICATIONS: None.   CONDITION: PACU - hemodynamically stable.Marland Kitchen   BRIEF CLINICAL NOTE: [place] Higgins is a 69 y.o. male with a long-standing history of Right hip arthritis. After failing conservative management, the patient was indicated for total hip arthroplasty. The risks, benefits, and alternatives to the procedure were explained, and the patient elected to proceed.  PROCEDURE IN DETAIL: Surgical site was marked by myself. Spinal anesthesia was obtained in the pre-op holding area. Once inside the operative room, a foley catheter was inserted. The patient was then positioned on the Hana table. All bony prominences were well padded. The hip was prepped and draped in the normal sterile surgical fashion. A time-out was called verifying side and site of surgery. The patient received IV antibiotics within 60 minutes of beginning the procedure.  The direct anterior approach to the hip was performed through the Hueter interval. Lateral femoral circumflex vessels were treated with the Auqumantys. The anterior capsule was exposed and an inverted T capsulotomy was made.The femoral neck cut was made to the level of the templated cut. A corkscrew was placed into the head and the head was removed. The femoral head was found to have eburnated bone. The head was passed to the back table and was measured.  Acetabular exposure was achieved, and the pulvinar and labrum were excised.  Sequental reaming of the acetabulum was then performed up to a size 59 mm reamer. A 60 mm cup was then opened and impacted into place at approximately 40 degrees of abduction and 20 degrees of anteversion. The final polyethylene liner was impacted into place and acetabular osteophytes were removed.   I then gained femoral exposure taking care to protect the abductors and greater trochanter. This was performed using standard external rotation, extension, and adduction. The capsule was peeled off the inner aspect of the greater trochanter, taking care to preserve the short external rotators. A cookie cutter was used to enter the femoral canal, and then the femoral canal finder was placed. Sequential broaching was performed up to a size 7. Calcar planer was used on the femoral neck remnant. I paced a hi offset neck and a trial head ball. The hip was reduced. Leg lengths and offset were checked fluoroscopically. The hip was dislocated and trial components were removed. The final implants were placed, and the hip was reduced.  Fluoroscopy was used to confirm component position and leg lengths. At 90 degrees of external rotation and full extension, the hip was stable to an anterior directed force.  The wound was copiously irrigated with a dilute betadine solution followed by normal saline. Marcaine solution was injected into the periarticular soft tissue. The wound was closed in layers using #1 Vicryl and V-Loc for the fascia, 2-0 Vicryl for the subcutaneous fat, 2-0 Monocryl for the deep dermal layer, 3-0 running Monocryl subcuticular stitch, and Dermabond for the skin. Once the glue was fully dried, an Aquacell Ag dressing was applied. The patient was transported to the recovery room in  stable condition. Sponge, needle, and instrument counts were correct at the end of the case x2. The patient tolerated the procedure well and there were no known complications.
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
## 2015-07-21 NOTE — Discharge Instructions (Signed)
°Dr. Jayleon Mcfarlane °Joint Replacement Specialist °[facility] °[address] °, [place] [address] °[phone] ° ° °TOTAL HIP REPLACEMENT POSTOPERATIVE DIRECTIONS ° ° ° °Hip Rehabilitation, Guidelines Following Surgery  ° °WEIGHT BEARING °Weight bearing as tolerated with assist device (walker, cane, etc) as directed, use it as long as suggested by your surgeon or therapist, typically at least 4-6 weeks. ° °The results of a hip operation are greatly improved after range of motion and muscle strengthening exercises. Follow all safety measures which are given to protect your hip. If any of these exercises cause increased pain or swelling in your joint, decrease the amount until you are comfortable again. Then slowly increase the exercises. Call your caregiver if you have problems or questions.  ° °HOME CARE INSTRUCTIONS  °Most of the following instructions are designed to prevent the dislocation of your new hip.  °Remove items at home which could result in a fall. This includes throw rugs or furniture in walking pathways.  °Continue medications as instructed at time of discharge. °· You may have some home medications which will be placed on hold until you complete the course of blood thinner medication. °· You may start showering once you are discharged home. Do not remove your dressing. °Do not put on socks or shoes without following the instructions of your caregivers.   °Sit on chairs with arms. Use the chair arms to help push yourself up when arising.  °Arrange for the use of a toilet seat elevator so you are not sitting low.  °· Walk with walker as instructed.  °You may resume a sexual relationship in one month or when given the OK by your caregiver.  °Use walker as long as suggested by your caregivers.  °You may put full weight on your legs and walk as much as is comfortable. °Avoid periods of inactivity such as sitting longer than an hour when not asleep. This helps prevent  blood clots.  °You may return to work once you are cleared by your surgeon.  °Do not drive a car for 6 weeks or until released by your surgeon.  °Do not drive while taking narcotics.  °Wear elastic stockings for two weeks following surgery during the day but you may remove then at night.  °Make sure you keep all of your appointments after your operation with all of your doctors and caregivers. You should call the office at the above phone number and make an appointment for approximately two weeks after the date of your surgery. °Please pick up a stool softener and laxative for home use as long as you are requiring pain medications. °· ICE to the affected hip every three hours for 30 minutes at a time and then as needed for pain and swelling. Continue to use ice on the hip for pain and swelling from surgery. You may notice swelling that will progress down to the foot and ankle.  This is normal after surgery.  Elevate the leg when you are not up walking on it.   °It is important for you to complete the blood thinner medication as prescribed by your doctor. °· Continue to use the breathing machine which will help keep your temperature down.  It is common for your temperature to cycle up and down following surgery, especially at night when you are not up moving around and exerting yourself.  The breathing machine keeps your lungs expanded and your temperature down. ° °RANGE OF MOTION AND STRENGTHENING EXERCISES  °These exercises are   designed to help you keep full movement of your hip joint. Follow your caregiver's or physical therapist's instructions. Perform all exercises about fifteen times, three times per day or as directed. Exercise both hips, even if you have had only one joint replacement. These exercises can be done on a training (exercise) mat, on the floor, on a table or on a bed. Use whatever works the best and is most comfortable for you. Use music or television while you are exercising so that the exercises  are a pleasant break in your day. This will make your life better with the exercises acting as a break in routine you can look forward to.  °Lying on your back, slowly slide your foot toward your buttocks, raising your knee up off the floor. Then slowly slide your foot back down until your leg is straight again.  °Lying on your back spread your legs as far apart as you can without causing discomfort.  °Lying on your side, raise your upper leg and foot straight up from the floor as far as is comfortable. Slowly lower the leg and repeat.  °Lying on your back, tighten up the muscle in the front of your thigh (quadriceps muscles). You can do this by keeping your leg straight and trying to raise your heel off the floor. This helps strengthen the largest muscle supporting your knee.  °Lying on your back, tighten up the muscles of your buttocks both with the legs straight and with the knee bent at a comfortable angle while keeping your heel on the floor.  ° °SKILLED REHAB INSTRUCTIONS: °If the patient is transferred to a skilled rehab facility following release from the hospital, a list of the current medications will be sent to the facility for the patient to continue.  When discharged from the skilled rehab facility, please have the facility set up the patient's Home Health Physical Therapy prior to being released. Also, the skilled facility will be responsible for providing the patient with their medications at time of release from the facility to include their pain medication and their blood thinner medication. If the patient is still at the rehab facility at time of the two week follow up appointment, the skilled rehab facility will also need to assist the patient in arranging follow up appointment in our office and any transportation needs. ° °MAKE SURE YOU:  °Understand these instructions.  °Will watch your condition.  °Will get help right away if you are not doing well or get worse. ° °Pick up stool softner and  laxative for home use following surgery while on pain medications. °Do not remove your dressing. °The dressing is waterproof--it is OK to take showers. °Continue to use ice for pain and swelling after surgery. °Do not use any lotions or creams on the incision until instructed by your surgeon. °Total Hip Protocol. ° ° °

## 2015-07-22 LAB — BASIC METABOLIC PANEL
ANION GAP: 3 — AB (ref 5–15)
BUN: 15 mg/dL (ref 6–20)
CALCIUM: 8.6 mg/dL — AB (ref 8.9–10.3)
CO2: 24 mmol/L (ref 22–32)
CREATININE: 0.85 mg/dL (ref 0.61–1.24)
Chloride: 106 mmol/L (ref 101–111)
Glucose, Bld: 124 mg/dL — ABNORMAL HIGH (ref 65–99)
Potassium: 4.2 mmol/L (ref 3.5–5.1)
SODIUM: 133 mmol/L — AB (ref 135–145)

## 2015-07-22 LAB — CBC
HEMATOCRIT: 23.3 % — AB (ref 39.0–52.0)
Hemoglobin: 7.4 g/dL — ABNORMAL LOW (ref 13.0–17.0)
MCH: 26.5 pg (ref 26.0–34.0)
MCHC: 31.8 g/dL (ref 30.0–36.0)
MCV: 83.5 fL (ref 78.0–100.0)
PLATELETS: 208 10*3/uL (ref 150–400)
RBC: 2.79 MIL/uL — ABNORMAL LOW (ref 4.22–5.81)
RDW: 14 % (ref 11.5–15.5)
WBC: 8.8 10*3/uL (ref 4.0–10.5)

## 2015-07-22 MED ORDER — ZOLPIDEM TARTRATE 5 MG PO TABS: 5.0000 mg | ORAL_TABLET | Freq: Every evening | ORAL | Status: DC | PRN

## 2015-07-22 MED ORDER — METHOCARBAMOL 500 MG PO TABS: 500.0000 mg | ORAL_TABLET | Freq: Four times a day (QID) | ORAL | Status: DC | PRN

## 2015-07-22 MED ORDER — FERROUS SULFATE 325 (65 FE) MG PO TABS
325.0000 mg | ORAL_TABLET | Freq: Every day | ORAL | Status: DC
  Administered 2015-07-22 – 2015-07-23 (×2): 325 mg via ORAL
  Filled 2015-07-22 (×3): qty 1
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
## 2015-07-22 NOTE — Progress Notes (Signed)
Physical Therapy Treatment Patient Details Name: Garrett Higgins MRN: [id number] DOB: [birth year] Today's Date: 2015/08/07    History of Present Illness R THR    PT Comments    Pt very motivated and progressing well with mobility.  Follow Up Recommendations  Home health PT     Equipment Recommendations  None recommended by PT    Recommendations for Other Services OT consult     Precautions / Restrictions Precautions Precautions: Fall Restrictions Weight Bearing Restrictions: No Other Position/Activity Restrictions: WBAT    Mobility  Bed Mobility Overal bed mobility: Needs Assistance Bed Mobility: Supine to Sit     Supine to sit: Min guard     General bed mobility comments: cues for sequence and use of L LE to self assist  Transfers Overall transfer level: Needs assistance Equipment used: Rolling walker (2 wheeled) Transfers: Sit to/from Stand Sit to Stand: Min guard         General transfer comment: cues for LE management and use of UEs to self assist  Ambulation/Gait Ambulation/Gait assistance: Min guard Ambulation Distance (Feet): 400 Feet Assistive device: Rolling walker (2 wheeled) Gait Pattern/deviations: Step-through pattern;Decreased step length - right;Decreased step length - left;Shuffle;Trunk flexed     General Gait Details: cues for posture, position from RW and initial sequence   Stairs            Wheelchair Mobility    Modified Rankin (Stroke Patients Only)       Balance                                    Cognition Arousal/Alertness: Awake/alert Behavior During Therapy: WFL for tasks assessed/performed Overall Cognitive Status: Within Functional Limits for tasks assessed                      Exercises Total Joint Exercises Ankle Circles/Pumps: AROM;Both;15 reps;Supine Quad Sets: AROM;Both;10 reps;Supine Heel Slides: AAROM;Right;15 reps;Supine Hip ABduction/ADduction: AAROM;Right;15  reps;Supine    General Comments        Pertinent Vitals/Pain Pain Assessment: 0-10 Pain Score: 3  Pain Location: R hip Pain Descriptors / Indicators: Aching;Sore Pain Intervention(s): Limited activity within patient's tolerance;Monitored during session;Premedicated before session;Ice applied    Home Living                      Prior Function            PT Goals (current goals can now be found in the care plan section) Acute Rehab PT Goals Patient Stated Goal: Resume previous lifestyle with decreased pain PT Goal Formulation: With patient Time For Goal Achievement: 07/28/15 Potential to Achieve Goals: Good Progress towards PT goals: Progressing toward goals    Frequency  7X/week    PT Plan Current plan remains appropriate    Co-evaluation             End of Session Equipment Utilized During Treatment: Gait belt Activity Tolerance: Patient tolerated treatment well Patient left: in chair;with call bell/phone within reach;with chair alarm set     Time: [id number] PT Time Calculation (min) (ACUTE ONLY): 24 min  Charges:  $Gait Training: 8-22 mins $Therapeutic Exercise: 8-22 mins                    G Codes:      Kayliah Tindol 07-Aug-2015, 11:50 AM

## 2015-07-22 NOTE — Progress Notes (Signed)
Physical Therapy Treatment Patient Details Name: Garrett Higgins MRN: [id number] DOB: 04/27/1946 Today's Date: August 07, 2015    History of Present Illness R THR-DA    PT Comments    Pt progressing well with mobility and hopeful for dc home tomorrow.  Follow Up Recommendations  Home health PT     Equipment Recommendations  None recommended by PT    Recommendations for Other Services OT consult     Precautions / Restrictions Precautions Precautions: Fall Restrictions Weight Bearing Restrictions: No Other Position/Activity Restrictions: WBAT    Mobility  Bed Mobility Overal bed mobility: Needs Assistance Bed Mobility: Supine to Sit;Sit to Supine     Supine to sit: Supervision Sit to supine: Supervision   General bed mobility comments: in chair.   Transfers Overall transfer level: Needs assistance Equipment used: Rolling walker (2 wheeled) Transfers: Sit to/from Stand Sit to Stand: Supervision         General transfer comment: cues for hand placement and LE management.  Ambulation/Gait Ambulation/Gait assistance: Min guard;Supervision Ambulation Distance (Feet): 400 Feet Assistive device: Rolling walker (2 wheeled) Gait Pattern/deviations: Step-to pattern;Step-through pattern;Decreased step length - right;Decreased step length - left;Shuffle;Trunk flexed     General Gait Details: cues for posture, position from RW and initial sequence   Stairs            Wheelchair Mobility    Modified Rankin (Stroke Patients Only)       Balance                                    Cognition Arousal/Alertness: Awake/alert Behavior During Therapy: WFL for tasks assessed/performed Overall Cognitive Status: Within Functional Limits for tasks assessed                      Exercises      General Comments        Pertinent Vitals/Pain Pain Assessment: 0-10 Pain Score: 4  Pain Location: R hip Pain Descriptors / Indicators:  Aching;Sore Pain Intervention(s): Limited activity within patient's tolerance;Monitored during session;Premedicated before session    [place] expects to be discharged to:: Private residence Living Arrangements: Spouse/significant other Available Help at Discharge: Family Type of Home: House Home Access: [place]: One [place]: Environmental consultant - 2 wheels;Walker - 4 wheels;Bedside commode;Tub bench      Prior Function Level of Independence: Independent          PT Goals (current goals can now be found in the care plan section) Acute Rehab PT Goals Patient Stated Goal: decrease pain PT Goal Formulation: With patient Time For Goal Achievement: 07/28/15 Potential to Achieve Goals: Good Progress towards PT goals: Progressing toward goals    Frequency  7X/week    PT Plan Current plan remains appropriate    Co-evaluation             End of Session Equipment Utilized During Treatment: Gait belt Activity Tolerance: Patient tolerated treatment well Patient left: in bed;with call bell/phone within reach     Time: 1411-1434 PT Time Calculation (min) (ACUTE ONLY): 23 min  Charges:  $Gait Training: 23-37 mins                    G Codes:      Garrett Higgins 07-Aug-2015, 3:58 PM

## 2015-07-22 NOTE — Evaluation (Signed)
Occupational Therapy Evaluation Patient Details Name: Garrett Higgins MRN: [id number] DOB: 1946/01/31 Today's Date: 07/22/2015    History of Present Illness R THR-DA   Clinical Impression   Practiced 3in1 transfers with walker and educated on proper height for 3in1. Discussed AE options but pt able to reach all the way to R LE today. He states he may obtain a reacher to pick up items. Discussed and demonstrated tub transfer technique but he declined need to practice--he states he is familiar with it and wife can help. Will follow on acute to progress ADL independence as needed.     Follow Up Recommendations  No OT follow up;Supervision/Assistance - 24 hour    Equipment Recommendations  None recommended by OT    Recommendations for Other Services       Precautions / Restrictions Precautions Precautions: Fall Restrictions Weight Bearing Restrictions: No Other Position/Activity Restrictions: WBAT      Mobility Bed Mobility      General bed mobility comments: in chair.   Transfers Overall transfer level: Needs assistance Equipment used: Rolling walker (2 wheeled) Transfers: Sit to/from Stand Sit to Stand: Min guard         General transfer comment: cues for hand placement and LE management.    Balance                                            ADL Overall ADL's : Needs assistance/impaired Eating/Feeding: Independent;Sitting   Grooming: Wash/dry hands;Set up;Sitting   Upper Body Bathing: Set up;Sitting   Lower Body Bathing: Min guard;Sit to/from stand   Upper Body Dressing : Set up;Sitting   Lower Body Dressing: Min guard;Sit to/from stand   Toilet Transfer: Min guard;Ambulation;BSC;RW   Toileting- Water quality scientist and Hygiene: Min guard;Sit to/from stand         General ADL Comments: Pt states he has a tub transfer bench and is familiar with how to use it and declines need to practice this transfer with OT. Demonstrated  technique for pt. Pt inquring about stepping over the tub but educated that with pain level and also stiff/painful L knee, it would be advisable to start out with sitting down on tubbench and pivoting LEs over tub edge. Pt verbalized understanding. Pt interested in AE options so educated him on all options but then when he attempted to lean forwared to touch toes, pt able to reach all the way down to L foot. He states he can manage then without AE but may decide to get a reacher for picking up items. Discussed sequence for LB dressing and having walker in front for safety when he stands. Practiced 3in1 transfer and min cues for safety.      Vision     Perception     Praxis      Pertinent Vitals/Pain Pain Assessment: 0-10 Pain Score: 6  Pain Location: 5 at rest. 6 with movement.  Pain Descriptors / Indicators: Sore;Aching Pain Intervention(s): Repositioned;Ice applied;Monitored during session     Hand Dominance     Extremity/Trunk Assessment Upper Extremity Assessment Upper Extremity Assessment: RUE deficits/detail;LUE deficits/detail RUE Deficits / Details: limited to approximately 90 degrees shoulder flexion. pt states both shoulders need replacing. LUE Deficits / Details: same as above.           Communication Communication Communication: No difficulties   Cognition Arousal/Alertness: Awake/alert Behavior During Therapy: [facility] for  tasks assessed/performed Overall Cognitive Status: Within Functional Limits for tasks assessed                     General Comments       Exercises      Shoulder Instructions      Home Living Family/patient expects to be discharged to:: Private residence Living Arrangements: Spouse/significant other Available Help at Discharge: Family Type of Home: House Home Access: Friend: One level     Bathroom Shower/Tub: Teacher, early years/pre: [place]: Environmental consultant - 2 wheels;Walker  - 4 wheels;Bedside commode;Tub bench          Prior Functioning/Environment Level of Independence: Independent             OT Diagnosis: Generalized weakness   OT Problem List: Decreased strength;Decreased knowledge of use of DME or AE   OT Treatment/Interventions: Self-care/ADL training;Patient/family education;Therapeutic activities;DME and/or AE instruction    OT Goals(Current goals can be found in the care plan section) Acute Rehab OT Goals Patient Stated Goal: decrease pain OT Goal Formulation: With patient Time For Goal Achievement: 07/29/15 Potential to Achieve Goals: Good  OT Frequency: Min 2X/week   Barriers to D/C:            Co-evaluation              End of Session Equipment Utilized During Treatment: Rolling walker  Activity Tolerance: Patient tolerated treatment well Patient left: in chair;with call bell/phone within reach   Time: 1052-1117 OT Time Calculation (min): 25 min Charges:  OT General Charges $OT Visit: 1 Procedure OT Evaluation $Initial OT Evaluation Tier I: 1 Procedure OT Treatments $Therapeutic Activity: 8-22 mins G-Codes:    Jules Schick  [id number] 07/22/2015, 12:17 PM

## 2015-07-22 NOTE — Progress Notes (Signed)
   Subjective:  Patient reports pain as mild.  No c/o. Denies N/V/CP/SOB.  Objective:   VITALS:   Filed Vitals:   07/21/15 1842 07/21/15 2111 07/22/15 0228 07/22/15 0722  BP: 124/66 111/65 121/59 138/64  Pulse: 103 85 89 87  Temp: 98.7 F (37.1 C) 97.8 F (36.6 C) 98.4 F (36.9 C) 98.2 F (36.8 C)  TempSrc:  Oral Oral Oral  Resp: 16 16 18 16   Height:      Weight:      SpO2: 99% 99% 96% 96%    ABD soft Sensation intact distally Intact pulses distally Dorsiflexion/Plantar flexion intact Incision: dressing C/D/I Compartment soft   Lab Results  Component Value Date   WBC 8.8 07/22/2015   HGB 7.4* 07/22/2015   HCT 23.3* 07/22/2015   MCV 83.5 07/22/2015   PLT 208 07/22/2015   BMET    Component Value Date/Time   NA 133* 07/22/2015 0438   K 4.2 07/22/2015 0438   CL 106 07/22/2015 0438   CO2 24 07/22/2015 0438   GLUCOSE 124* 07/22/2015 0438   BUN 15 07/22/2015 0438   CREATININE 0.85 07/22/2015 0438   CALCIUM 8.6* 07/22/2015 0438   GFRNONAA >60 07/22/2015 0438   GFRAA >60 07/22/2015 0438     Assessment/Plan: 1 Day Post-Op   Principal Problem:   Osteoarthritis of right hip   WBAT with walker DVT ppx: ASA, SCDs, TEDs PT/OT Acute blood loss on chronic anemia: asypmtomatic, check labs in am, transfuse < 7.0 PO pain control Dispo: check labs in am, d/c home if hgb >= 7.0   SwinteckHorald Pollen 07/22/2015, 7:56 AM   Rod Can, MD Cell [phone]

## 2015-07-23 LAB — CBC
HEMATOCRIT: 26.1 % — AB (ref 39.0–52.0)
Hemoglobin: 8.4 g/dL — ABNORMAL LOW (ref 13.0–17.0)
MCH: 27.5 pg (ref 26.0–34.0)
MCHC: 32.2 g/dL (ref 30.0–36.0)
MCV: 85.3 fL (ref 78.0–100.0)
PLATELETS: 234 10*3/uL (ref 150–400)
RBC: 3.06 MIL/uL — ABNORMAL LOW (ref 4.22–5.81)
RDW: 14.4 % (ref 11.5–15.5)
WBC: 10 10*3/uL (ref 4.0–10.5)

## 2015-07-23 NOTE — Progress Notes (Signed)
Subjective: 2 Days Post-Op Procedure(s) (LRB): RIGHT TOTAL HIP ARTHROPLASTY ANTERIOR APPROACH (Right) Patient reports pain as 1 on 0-10 scale. Ready for Dc today. No post-Op problems.   Objective: Vital signs in last 24 hours: Temp:  [97.9 F (36.6 C)-99.1 F (37.3 C)] 97.9 F (36.6 C) (11/13 0629) Pulse Rate:  [83-96] 96 (11/12 1935) Resp:  [16-18] 16 (11/13 0629) BP: (114-134)/(59-79) 134/79 mmHg (11/13 0629) SpO2:  [96 %-98 %] 96 % (11/13 0629)  Intake/Output from previous day: 11/12 0701 - 11/13 0700 In: 1320 [P.O.:1200; I.V.:120] Out: 2050 [Urine:1400; Stool:650] Intake/Output this shift:     Recent Labs  07/22/15 0438 07/23/15 0614  HGB 7.4* 8.4*    Recent Labs  07/22/15 0438 07/23/15 0614  WBC 8.8 10.0  RBC 2.79* 3.06*  HCT 23.3* 26.1*  PLT 208 234    Recent Labs  07/22/15 0438  NA 133*  K 4.2  CL 106  CO2 24  BUN 15  CREATININE 0.85  GLUCOSE 124*  CALCIUM 8.6*   No results for input(s): LABPT, INR in the last 72 hours.  Neurovascular intact Dorsiflexion/Plantar flexion intact  Assessment/Plan: 2 Days Post-Op Procedure(s) (LRB): RIGHT TOTAL HIP ARTHROPLASTY ANTERIOR APPROACH (Right) Discharge home with home health  Ellakate Gonsalves A 07/23/2015, 9:09 AM

## 2015-07-23 NOTE — Care Management Note (Signed)
Case Management Note  Patient Details  Name: DRAYK PASSEY MRN: [id number] Date of Birth: May 27, 1946  Subjective/Objective:    RIGHT TOTAL HIP ARTHROPLASTY ANTERIOR APPROACH                Action/Plan: NCM spoke to pt. Wife, Peggy at home to assist with his care. Has RW and 3n1 bedside commode at home. Offered choice for [facility]. Pt requested [facility].   Expected Discharge Date:  07/23/15               Expected Discharge Plan:  [place]  In-House Referral:     Discharge planning Services  CM Consult  Post Acute Care Choice:  Home Health Choice offered to:  Patient   HH Arranged:  PT [place]:  [place]  Status of Service:     Medicare Important Message Given:    Date Medicare IM Given:    Medicare IM give by:    Date Additional Medicare IM Given:    Additional Medicare Important Message give by:     If discussed at [place] of Stay Meetings, dates discussed:    Additional Comments:  Erenest Rasher, RN 07/23/2015, 9:48 AM

## 2015-07-23 NOTE — Progress Notes (Signed)
Physical Therapy Treatment Patient Details Name: Garrett Higgins MRN: [id number] DOB: 1945-12-23 Today's Date: 03-Aug-2015    History of Present Illness R THR-DA    PT Comments    Pt progressing well and eager for dc home.  Reviewed therex, stairs, and car transfers.  Follow Up Recommendations  Home health PT     Equipment Recommendations  None recommended by PT    Recommendations for Other Services OT consult     Precautions / Restrictions Precautions Precautions: Fall Restrictions Weight Bearing Restrictions: No Other Position/Activity Restrictions: WBAT    Mobility  Bed Mobility Overal bed mobility: Modified Independent Bed Mobility: Supine to Sit     Supine to sit: Modified independent (Device/Increase time)        Transfers Overall transfer level: Needs assistance Equipment used: Rolling walker (2 wheeled) Transfers: Sit to/from Stand Sit to Stand: Supervision            Ambulation/Gait Ambulation/Gait assistance: Supervision Ambulation Distance (Feet): 600 Feet Assistive device: Rolling walker (2 wheeled) Gait Pattern/deviations: Step-through pattern;Shuffle Gait velocity: mod pace   General Gait Details: min cues for position from RW   Stairs Stairs: Yes Stairs assistance: Min guard Stair Management: Two rails;Step to pattern;Forwards Number of Stairs: 2 General stair comments: cues for sequence and foot/RW placement  Wheelchair Mobility    Modified Rankin (Stroke Patients Only)       Balance                                    Cognition Arousal/Alertness: Awake/alert Behavior During Therapy: WFL for tasks assessed/performed Overall Cognitive Status: Within Functional Limits for tasks assessed                      Exercises Total Joint Exercises Ankle Circles/Pumps: AROM;Both;15 reps;Supine Quad Sets: AROM;Both;10 reps;Supine Heel Slides: AAROM;Right;Supine;20 reps Hip ABduction/ADduction:  AAROM;Right;Supine;20 reps;Standing Long Arc Quad: AROM;Right;10 reps;Supine Marching in Standing: AROM;Right;15 reps;Standing    General Comments        Pertinent Vitals/Pain Pain Assessment: 0-10 Pain Score: 3  Pain Location: R hip Pain Descriptors / Indicators: Aching;Sore Pain Intervention(s): Limited activity within patient's tolerance;Monitored during session;Premedicated before session;Ice applied    Home Living                      Prior Function            PT Goals (current goals can now be found in the care plan section) Acute Rehab PT Goals Patient Stated Goal: decrease pain PT Goal Formulation: With patient Time For Goal Achievement: 07/28/15 Potential to Achieve Goals: Good Progress towards PT goals: Progressing toward goals    Frequency  7X/week    PT Plan Current plan remains appropriate    Co-evaluation             End of Session   Activity Tolerance: Patient tolerated treatment well Patient left: in chair;with call bell/phone within reach     Time: 0824-0853 PT Time Calculation (min) (ACUTE ONLY): 29 min  Charges:  $Gait Training: 8-22 mins $Therapeutic Exercise: 8-22 mins                    G Codes:      Britain Saber 08/03/15, 11:11 AM

## 2015-07-23 NOTE — Evaluation (Signed)
  Occupational Therapy  Patient Details Name: Garrett Higgins MRN: [id number] DOB: 11/22/1945 Today's Date: 2015-08-22    History of Present Illness R THR-DA   Clinical Impression   DC this day   Follow Up Recommendations  No OT follow up;Supervision/Assistance - 24 hour    Equipment Recommendations  None recommended by OT       Precautions / Restrictions Precautions Precautions: Fall Restrictions Weight Bearing Restrictions: No Other Position/Activity Restrictions: WBAT      Mobility Bed Mobility Overal bed mobility: Modified Independent Bed Mobility: Supine to Sit     Supine to sit: Modified independent (Device/Increase time)        Transfers Overall transfer level: Needs assistance Equipment used: Rolling walker (2 wheeled)   Sit to Stand: Supervision                   ADL                                   Tub/ Shower Transfer: English as a second language teacher Details (indicate cue type and reason): VC for safety Functional mobility during ADLs: Cueing for safety;Cueing for sequencing General ADL Comments: VC for safety               Pertinent Vitals/Pain Pain Score: 2  Pain Location:  r hip Pain Descriptors / Indicators: Sore Pain Intervention(s): Monitored during session              Cognition Arousal/Alertness: Awake/alert Behavior During Therapy: WFL for tasks assessed/performed Overall Cognitive Status: Within Functional Limits for tasks assessed                                                    OT Frequency: Min 2X/week              End of Session Equipment Utilized During Treatment: Rolling walker  Activity Tolerance: Patient tolerated treatment well Patient left: in chair;with call bell/phone within reach   Time: 1110-1120 OT Time Calculation (min): 10 min Charges:  OT General Charges $OT Visit: 1 Procedure OT Treatments $Self Care/Home Management : 8-22  mins G-Codes:    Payton Mccallum D Aug 22, 2015, 12:27 PM

## 2015-07-24 DIAGNOSIS — I10 Essential (primary) hypertension: Secondary | ICD-10-CM | POA: Diagnosis not present

## 2015-07-24 DIAGNOSIS — D649 Anemia, unspecified: Secondary | ICD-10-CM | POA: Diagnosis not present

## 2015-07-24 DIAGNOSIS — Z471 Aftercare following joint replacement surgery: Secondary | ICD-10-CM | POA: Diagnosis not present

## 2015-07-24 DIAGNOSIS — Z96641 Presence of right artificial hip joint: Secondary | ICD-10-CM | POA: Diagnosis not present

## 2015-07-24 DIAGNOSIS — K219 Gastro-esophageal reflux disease without esophagitis: Secondary | ICD-10-CM | POA: Diagnosis not present

## 2015-07-27 DIAGNOSIS — I10 Essential (primary) hypertension: Secondary | ICD-10-CM | POA: Diagnosis not present

## 2015-07-27 DIAGNOSIS — D649 Anemia, unspecified: Secondary | ICD-10-CM | POA: Diagnosis not present

## 2015-07-27 DIAGNOSIS — Z471 Aftercare following joint replacement surgery: Secondary | ICD-10-CM | POA: Diagnosis not present

## 2015-07-27 DIAGNOSIS — K219 Gastro-esophageal reflux disease without esophagitis: Secondary | ICD-10-CM | POA: Diagnosis not present

## 2015-07-27 DIAGNOSIS — Z96641 Presence of right artificial hip joint: Secondary | ICD-10-CM | POA: Diagnosis not present

## 2015-08-02 DIAGNOSIS — Z96641 Presence of right artificial hip joint: Secondary | ICD-10-CM | POA: Diagnosis not present

## 2015-08-02 DIAGNOSIS — Z471 Aftercare following joint replacement surgery: Secondary | ICD-10-CM | POA: Diagnosis not present

## 2015-08-30 DIAGNOSIS — Z96641 Presence of right artificial hip joint: Secondary | ICD-10-CM | POA: Diagnosis not present

## 2015-08-30 DIAGNOSIS — Z471 Aftercare following joint replacement surgery: Secondary | ICD-10-CM | POA: Diagnosis not present

## 2016-06-04 DIAGNOSIS — R69 Illness, unspecified: Secondary | ICD-10-CM | POA: Diagnosis not present

## 2016-08-09 DIAGNOSIS — J209 Acute bronchitis, unspecified: Secondary | ICD-10-CM | POA: Diagnosis not present

## 2016-08-09 DIAGNOSIS — K1370 Unspecified lesions of oral mucosa: Secondary | ICD-10-CM | POA: Diagnosis not present

## 2016-08-29 ENCOUNTER — Ambulatory Visit (INDEPENDENT_AMBULATORY_CARE_PROVIDER_SITE_OTHER): Payer: 59 | Admitting: Otolaryngology

## 2016-08-29 ENCOUNTER — Other Ambulatory Visit (INDEPENDENT_AMBULATORY_CARE_PROVIDER_SITE_OTHER): Payer: Self-pay | Admitting: Otolaryngology

## 2016-08-29 DIAGNOSIS — D3705 Neoplasm of uncertain behavior of pharynx: Secondary | ICD-10-CM | POA: Diagnosis not present

## 2016-09-12 DIAGNOSIS — M1991 Primary osteoarthritis, unspecified site: Secondary | ICD-10-CM | POA: Diagnosis not present

## 2016-09-12 DIAGNOSIS — G894 Chronic pain syndrome: Secondary | ICD-10-CM | POA: Diagnosis not present

## 2016-09-12 DIAGNOSIS — Z6826 Body mass index (BMI) 26.0-26.9, adult: Secondary | ICD-10-CM | POA: Diagnosis not present

## 2016-09-12 DIAGNOSIS — E663 Overweight: Secondary | ICD-10-CM | POA: Diagnosis not present

## 2016-09-12 DIAGNOSIS — Z1389 Encounter for screening for other disorder: Secondary | ICD-10-CM | POA: Diagnosis not present

## 2016-09-12 DIAGNOSIS — M2550 Pain in unspecified joint: Secondary | ICD-10-CM | POA: Diagnosis not present

## 2016-12-23 DIAGNOSIS — Z6825 Body mass index (BMI) 25.0-25.9, adult: Secondary | ICD-10-CM | POA: Diagnosis not present

## 2016-12-23 DIAGNOSIS — G894 Chronic pain syndrome: Secondary | ICD-10-CM | POA: Diagnosis not present

## 2016-12-23 DIAGNOSIS — E663 Overweight: Secondary | ICD-10-CM | POA: Diagnosis not present

## 2017-01-22 DIAGNOSIS — Z6825 Body mass index (BMI) 25.0-25.9, adult: Secondary | ICD-10-CM | POA: Diagnosis not present

## 2017-01-22 DIAGNOSIS — M1991 Primary osteoarthritis, unspecified site: Secondary | ICD-10-CM | POA: Diagnosis not present

## 2017-01-22 DIAGNOSIS — Z0000 Encounter for general adult medical examination without abnormal findings: Secondary | ICD-10-CM | POA: Diagnosis not present

## 2017-01-22 DIAGNOSIS — I10 Essential (primary) hypertension: Secondary | ICD-10-CM | POA: Diagnosis not present

## 2017-01-22 DIAGNOSIS — K219 Gastro-esophageal reflux disease without esophagitis: Secondary | ICD-10-CM | POA: Diagnosis not present

## 2017-01-22 DIAGNOSIS — Z1389 Encounter for screening for other disorder: Secondary | ICD-10-CM | POA: Diagnosis not present

## 2017-01-22 DIAGNOSIS — Z23 Encounter for immunization: Secondary | ICD-10-CM | POA: Diagnosis not present

## 2017-04-16 DIAGNOSIS — I10 Essential (primary) hypertension: Secondary | ICD-10-CM | POA: Diagnosis not present

## 2017-04-16 DIAGNOSIS — M1991 Primary osteoarthritis, unspecified site: Secondary | ICD-10-CM | POA: Diagnosis not present

## 2017-04-16 DIAGNOSIS — Z6826 Body mass index (BMI) 26.0-26.9, adult: Secondary | ICD-10-CM | POA: Diagnosis not present

## 2017-04-16 DIAGNOSIS — E663 Overweight: Secondary | ICD-10-CM | POA: Diagnosis not present

## 2017-06-13 DIAGNOSIS — R69 Illness, unspecified: Secondary | ICD-10-CM | POA: Diagnosis not present

## 2017-07-28 DIAGNOSIS — Z1389 Encounter for screening for other disorder: Secondary | ICD-10-CM | POA: Diagnosis not present

## 2017-07-28 DIAGNOSIS — J329 Chronic sinusitis, unspecified: Secondary | ICD-10-CM | POA: Diagnosis not present

## 2017-07-28 DIAGNOSIS — Z6826 Body mass index (BMI) 26.0-26.9, adult: Secondary | ICD-10-CM | POA: Diagnosis not present

## 2017-07-28 DIAGNOSIS — I10 Essential (primary) hypertension: Secondary | ICD-10-CM | POA: Diagnosis not present

## 2017-07-28 DIAGNOSIS — G894 Chronic pain syndrome: Secondary | ICD-10-CM | POA: Diagnosis not present

## 2017-07-28 DIAGNOSIS — K219 Gastro-esophageal reflux disease without esophagitis: Secondary | ICD-10-CM | POA: Diagnosis not present

## 2017-07-28 DIAGNOSIS — E663 Overweight: Secondary | ICD-10-CM | POA: Diagnosis not present

## 2017-10-23 DIAGNOSIS — M1991 Primary osteoarthritis, unspecified site: Secondary | ICD-10-CM | POA: Diagnosis not present

## 2017-10-23 DIAGNOSIS — Z6826 Body mass index (BMI) 26.0-26.9, adult: Secondary | ICD-10-CM | POA: Diagnosis not present

## 2017-10-23 DIAGNOSIS — S30851A Superficial foreign body of abdominal wall, initial encounter: Secondary | ICD-10-CM | POA: Diagnosis not present

## 2017-10-23 DIAGNOSIS — E663 Overweight: Secondary | ICD-10-CM | POA: Diagnosis not present

## 2017-10-23 DIAGNOSIS — G894 Chronic pain syndrome: Secondary | ICD-10-CM | POA: Diagnosis not present

## 2017-10-23 DIAGNOSIS — Z1389 Encounter for screening for other disorder: Secondary | ICD-10-CM | POA: Diagnosis not present

## 2018-01-15 DIAGNOSIS — Z1389 Encounter for screening for other disorder: Secondary | ICD-10-CM | POA: Diagnosis not present

## 2018-01-15 DIAGNOSIS — E663 Overweight: Secondary | ICD-10-CM | POA: Diagnosis not present

## 2018-01-15 DIAGNOSIS — I10 Essential (primary) hypertension: Secondary | ICD-10-CM | POA: Diagnosis not present

## 2018-01-15 DIAGNOSIS — M1991 Primary osteoarthritis, unspecified site: Secondary | ICD-10-CM | POA: Diagnosis not present

## 2018-01-15 DIAGNOSIS — Z6825 Body mass index (BMI) 25.0-25.9, adult: Secondary | ICD-10-CM | POA: Diagnosis not present

## 2018-01-15 DIAGNOSIS — G894 Chronic pain syndrome: Secondary | ICD-10-CM | POA: Diagnosis not present

## 2018-04-06 DIAGNOSIS — G894 Chronic pain syndrome: Secondary | ICD-10-CM | POA: Diagnosis not present

## 2018-04-06 DIAGNOSIS — Z6825 Body mass index (BMI) 25.0-25.9, adult: Secondary | ICD-10-CM | POA: Diagnosis not present

## 2018-04-06 DIAGNOSIS — E663 Overweight: Secondary | ICD-10-CM | POA: Diagnosis not present

## 2018-04-06 DIAGNOSIS — R1013 Epigastric pain: Secondary | ICD-10-CM | POA: Diagnosis not present

## 2018-04-06 DIAGNOSIS — K219 Gastro-esophageal reflux disease without esophagitis: Secondary | ICD-10-CM | POA: Diagnosis not present

## 2018-04-06 DIAGNOSIS — E782 Mixed hyperlipidemia: Secondary | ICD-10-CM | POA: Diagnosis not present

## 2018-04-06 DIAGNOSIS — I10 Essential (primary) hypertension: Secondary | ICD-10-CM | POA: Diagnosis not present

## 2018-04-06 DIAGNOSIS — R69 Illness, unspecified: Secondary | ICD-10-CM | POA: Diagnosis not present

## 2018-04-12 ENCOUNTER — Other Ambulatory Visit: Payer: Self-pay

## 2018-04-12 ENCOUNTER — Emergency Department (HOSPITAL_COMMUNITY)
Admission: EM | Admit: 2018-04-12 | Discharge: 2018-04-12 | Disposition: A | Discharge status: Home / Self Care | Payer: Medicare HMO | Source: Home / Self Care | Attending: Emergency Medicine | Admitting: Emergency Medicine

## 2018-04-12 ENCOUNTER — Emergency Department (HOSPITAL_COMMUNITY): Payer: Medicare HMO

## 2018-04-12 ENCOUNTER — Encounter (HOSPITAL_COMMUNITY): Payer: Self-pay | Admitting: Emergency Medicine

## 2018-04-12 DIAGNOSIS — R1084 Generalized abdominal pain: Secondary | ICD-10-CM

## 2018-04-12 DIAGNOSIS — Z7982 Long term (current) use of aspirin: Secondary | ICD-10-CM | POA: Insufficient documentation

## 2018-04-12 DIAGNOSIS — Z87891 Personal history of nicotine dependence: Secondary | ICD-10-CM

## 2018-04-12 DIAGNOSIS — I10 Essential (primary) hypertension: Secondary | ICD-10-CM

## 2018-04-12 DIAGNOSIS — Z79899 Other long term (current) drug therapy: Secondary | ICD-10-CM | POA: Insufficient documentation

## 2018-04-12 DIAGNOSIS — R197 Diarrhea, unspecified: Secondary | ICD-10-CM | POA: Diagnosis not present

## 2018-04-12 DIAGNOSIS — R1110 Vomiting, unspecified: Secondary | ICD-10-CM | POA: Diagnosis not present

## 2018-04-12 DIAGNOSIS — K56609 Unspecified intestinal obstruction, unspecified as to partial versus complete obstruction: Secondary | ICD-10-CM | POA: Diagnosis not present

## 2018-04-12 DIAGNOSIS — K56600 Partial intestinal obstruction, unspecified as to cause: Secondary | ICD-10-CM | POA: Insufficient documentation

## 2018-04-12 DIAGNOSIS — R109 Unspecified abdominal pain: Secondary | ICD-10-CM | POA: Diagnosis not present

## 2018-04-12 LAB — COMPREHENSIVE METABOLIC PANEL
ALBUMIN: 4 g/dL (ref 3.5–5.0)
ALK PHOS: 69 U/L (ref 38–126)
ALT: 15 U/L (ref 0–44)
AST: 17 U/L (ref 15–41)
Anion gap: 6 (ref 5–15)
BUN: 12 mg/dL (ref 8–23)
CALCIUM: 9.9 mg/dL (ref 8.9–10.3)
CO2: 24 mmol/L (ref 22–32)
CREATININE: 0.88 mg/dL (ref 0.61–1.24)
Chloride: 104 mmol/L (ref 98–111)
GFR calc Af Amer: >60 mL/min (ref >60)
GFR calc non Af Amer: >60 mL/min (ref >60)
GLUCOSE: 112 mg/dL — AB (ref 70–99)
Potassium: 4 mmol/L (ref 3.5–5.1)
Sodium: 134 mmol/L — ABNORMAL LOW (ref 135–145)
Total Bilirubin: 0.8 mg/dL (ref 0.3–1.2)
Total Protein: 7.1 g/dL (ref 6.5–8.1)

## 2018-04-12 LAB — URINALYSIS, ROUTINE W REFLEX MICROSCOPIC
BILIRUBIN URINE: NEGATIVE
Glucose, UA: NEGATIVE mg/dL
HGB URINE DIPSTICK: NEGATIVE
Ketones, ur: 5 mg/dL — AB
Leukocytes, UA: NEGATIVE
Nitrite: NEGATIVE
Protein, ur: NEGATIVE mg/dL
SPECIFIC GRAVITY, URINE: 1.041 — AB (ref 1.005–1.030)
pH: 5 (ref 5.0–8.0)

## 2018-04-12 LAB — CBC
HCT: 43.2 % (ref 39.0–52.0)
HEMOGLOBIN: 14.5 g/dL (ref 13.0–17.0)
MCH: 31.7 pg (ref 26.0–34.0)
MCHC: 33.6 g/dL (ref 30.0–36.0)
MCV: 94.3 fL (ref 78.0–100.0)
PLATELETS: 207 10*3/uL (ref 150–400)
RBC: 4.58 MIL/uL (ref 4.22–5.81)
RDW: 12.2 % (ref 11.5–15.5)
WBC: 5.5 10*3/uL (ref 4.0–10.5)

## 2018-04-12 LAB — LIPASE, BLOOD: Lipase: 33 U/L (ref 11–51)

## 2018-04-12 MED ORDER — SODIUM CHLORIDE 0.9 % IV BOLUS: 1000.0000 mL | Freq: Once | INTRAVENOUS | Status: DC

## 2018-04-12 MED ORDER — IOPAMIDOL (ISOVUE-300) INJECTION 61%
100.0000 mL | Freq: Once | INTRAVENOUS | Status: AC | PRN
  Administered 2018-04-12: 100 mL via INTRAVENOUS

## 2018-04-12 NOTE — Discharge Instructions (Addendum)
You were evaluated in the emergency department for 2 weeks of abdominal pain.  Your CAT scan was concerning for a bowel obstruction but that did not completely fit your symptoms.  You were offered admission but you felt well enough to go home.  I reviewed your case with Dr. Arnoldo Morale from general surgery and he said he can see you as an outpatient.  If you were to get worse with pain or fever please return to the emergency department

## 2018-04-12 NOTE — ED Notes (Signed)
Pt returned from CT °

## 2018-04-12 NOTE — ED Notes (Signed)
EDP at bedside updating patient and family. 
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
## 2018-04-12 NOTE — ED Notes (Signed)
Patient transported to CT 

## 2018-04-12 NOTE — ED Notes (Signed)
EDP at bedside  

## 2018-04-14 ENCOUNTER — Emergency Department (HOSPITAL_COMMUNITY): Payer: Medicare HMO

## 2018-04-14 ENCOUNTER — Inpatient Hospital Stay (HOSPITAL_COMMUNITY)
Admission: EM | Admit: 2018-04-14 | Discharge: 2018-04-16 | DRG: 390 | Disposition: A | Discharge status: Home / Self Care | Payer: Medicare HMO | Attending: Internal Medicine | Admitting: Internal Medicine

## 2018-04-14 ENCOUNTER — Other Ambulatory Visit: Payer: Self-pay

## 2018-04-14 ENCOUNTER — Encounter (HOSPITAL_COMMUNITY): Payer: Self-pay | Admitting: Emergency Medicine

## 2018-04-14 DIAGNOSIS — I10 Essential (primary) hypertension: Secondary | ICD-10-CM | POA: Diagnosis present

## 2018-04-14 DIAGNOSIS — K5652 Intestinal adhesions [bands] with complete obstruction: Secondary | ICD-10-CM | POA: Insufficient documentation

## 2018-04-14 DIAGNOSIS — Z87891 Personal history of nicotine dependence: Secondary | ICD-10-CM | POA: Diagnosis not present

## 2018-04-14 DIAGNOSIS — Z96641 Presence of right artificial hip joint: Secondary | ICD-10-CM | POA: Diagnosis present

## 2018-04-14 DIAGNOSIS — K56600 Partial intestinal obstruction, unspecified as to cause: Secondary | ICD-10-CM | POA: Diagnosis not present

## 2018-04-14 DIAGNOSIS — R140 Abdominal distension (gaseous): Secondary | ICD-10-CM | POA: Diagnosis not present

## 2018-04-14 DIAGNOSIS — K56609 Unspecified intestinal obstruction, unspecified as to partial versus complete obstruction: Secondary | ICD-10-CM | POA: Diagnosis present

## 2018-04-14 DIAGNOSIS — E7800 Pure hypercholesterolemia, unspecified: Secondary | ICD-10-CM | POA: Diagnosis not present

## 2018-04-14 DIAGNOSIS — K56699 Other intestinal obstruction unspecified as to partial versus complete obstruction: Secondary | ICD-10-CM | POA: Diagnosis not present

## 2018-04-14 DIAGNOSIS — E785 Hyperlipidemia, unspecified: Secondary | ICD-10-CM | POA: Diagnosis not present

## 2018-04-14 DIAGNOSIS — R109 Unspecified abdominal pain: Secondary | ICD-10-CM | POA: Diagnosis not present

## 2018-04-14 DIAGNOSIS — Z79891 Long term (current) use of opiate analgesic: Secondary | ICD-10-CM | POA: Diagnosis not present

## 2018-04-14 DIAGNOSIS — K219 Gastro-esophageal reflux disease without esophagitis: Secondary | ICD-10-CM | POA: Diagnosis present

## 2018-04-14 DIAGNOSIS — Z79899 Other long term (current) drug therapy: Secondary | ICD-10-CM

## 2018-04-14 LAB — LIPASE, BLOOD: Lipase: 36 U/L (ref 11–51)

## 2018-04-14 LAB — CBC
HCT: 45.5 % (ref 39.0–52.0)
HEMOGLOBIN: 15.6 g/dL (ref 13.0–17.0)
MCH: 32.2 pg (ref 26.0–34.0)
MCHC: 34.3 g/dL (ref 30.0–36.0)
MCV: 93.8 fL (ref 78.0–100.0)
Platelets: 249 10*3/uL (ref 150–400)
RBC: 4.85 MIL/uL (ref 4.22–5.81)
RDW: 12.2 % (ref 11.5–15.5)
WBC: 7.6 10*3/uL (ref 4.0–10.5)

## 2018-04-14 LAB — COMPREHENSIVE METABOLIC PANEL
ALT: 17 U/L (ref 0–44)
AST: 17 U/L (ref 15–41)
Albumin: 4.3 g/dL (ref 3.5–5.0)
Alkaline Phosphatase: 77 U/L (ref 38–126)
Anion gap: 10 (ref 5–15)
BILIRUBIN TOTAL: 0.7 mg/dL (ref 0.3–1.2)
BUN: 14 mg/dL (ref 8–23)
CO2: 22 mmol/L (ref 22–32)
Calcium: 10.3 mg/dL (ref 8.9–10.3)
Chloride: 104 mmol/L (ref 98–111)
Creatinine, Ser: 0.89 mg/dL (ref 0.61–1.24)
GFR calc Af Amer: >60 mL/min (ref >60)
Glucose, Bld: 116 mg/dL — ABNORMAL HIGH (ref 70–99)
Potassium: 4.1 mmol/L (ref 3.5–5.1)
Sodium: 136 mmol/L (ref 135–145)
Total Protein: 7.5 g/dL (ref 6.5–8.1)

## 2018-04-14 MED ORDER — ONDANSETRON HCL 4 MG/2ML IJ SOLN
4.0000 mg | INTRAMUSCULAR | Status: DC | PRN
  Administered 2018-04-14: 4 mg via INTRAVENOUS
  Filled 2018-04-14: qty 2

## 2018-04-14 MED ORDER — MORPHINE SULFATE (PF) 4 MG/ML IV SOLN
4.0000 mg | INTRAVENOUS | Status: DC | PRN
  Administered 2018-04-14: 4 mg via INTRAVENOUS
  Filled 2018-04-14 (×2): qty 1

## 2018-04-14 MED ORDER — MORPHINE SULFATE (PF) 4 MG/ML IV SOLN
4.0000 mg | INTRAVENOUS | Status: AC | PRN
  Administered 2018-04-14 (×2): 4 mg via INTRAVENOUS
  Filled 2018-04-14 (×2): qty 1

## 2018-04-14 MED ORDER — SODIUM CHLORIDE 0.9 % IV SOLN
INTRAVENOUS | Status: DC
  Administered 2018-04-14 – 2018-04-15 (×3): via INTRAVENOUS

## 2018-04-14 NOTE — ED Notes (Signed)
Attempted to place 31F NG tube. Unable to pass.
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
## 2018-04-14 NOTE — ED Notes (Signed)
Pt states he is not able to urinate at this time, aware of DO.

## 2018-04-14 NOTE — ED Notes (Signed)
Attempted to place 74F and 27F NG tube. Unable to pass.

## 2018-04-14 NOTE — ED Triage Notes (Signed)
Patient states he was here two days ago for bowel obstruction. States he felt better yesterday and started having abdominal pain with vomiting today. States he was supposed to follow up with Dr Arnoldo Morale but he is on vacation and was told by Dr Constance Haw to come to ER.
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
## 2018-04-14 NOTE — ED Notes (Signed)
Dr Constance Haw paged and informed unable to pass NG tube.
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
## 2018-04-15 ENCOUNTER — Inpatient Hospital Stay (HOSPITAL_COMMUNITY): Payer: Medicare HMO

## 2018-04-15 DIAGNOSIS — K56609 Unspecified intestinal obstruction, unspecified as to partial versus complete obstruction: Secondary | ICD-10-CM

## 2018-04-15 LAB — BASIC METABOLIC PANEL
Anion gap: 5 (ref 5–15)
BUN: 13 mg/dL (ref 8–23)
CHLORIDE: 109 mmol/L (ref 98–111)
CO2: 23 mmol/L (ref 22–32)
CREATININE: 0.86 mg/dL (ref 0.61–1.24)
Calcium: 9.2 mg/dL (ref 8.9–10.3)
GFR calc Af Amer: >60 mL/min (ref >60)
GFR calc non Af Amer: >60 mL/min (ref >60)
GLUCOSE: 90 mg/dL (ref 70–99)
Potassium: 4.2 mmol/L (ref 3.5–5.1)
SODIUM: 137 mmol/L (ref 135–145)

## 2018-04-15 LAB — CBC
HCT: 37.8 % — ABNORMAL LOW (ref 39.0–52.0)
Hemoglobin: 12.3 g/dL — ABNORMAL LOW (ref 13.0–17.0)
MCH: 31.1 pg (ref 26.0–34.0)
MCHC: 32.5 g/dL (ref 30.0–36.0)
MCV: 95.7 fL (ref 78.0–100.0)
PLATELETS: 192 10*3/uL (ref 150–400)
RBC: 3.95 MIL/uL — ABNORMAL LOW (ref 4.22–5.81)
RDW: 12.5 % (ref 11.5–15.5)
WBC: 4.1 10*3/uL (ref 4.0–10.5)

## 2018-04-15 NOTE — Progress Notes (Signed)
Patient states he has passed gas all night, no BM.  Pain free at this time.  Asking for ice chips

## 2018-04-15 NOTE — Progress Notes (Signed)
Patient had large BM - soft solid
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
## 2018-04-16 ENCOUNTER — Inpatient Hospital Stay (HOSPITAL_COMMUNITY): Payer: Medicare HMO

## 2018-04-16 DIAGNOSIS — K219 Gastro-esophageal reflux disease without esophagitis: Secondary | ICD-10-CM

## 2018-04-16 MED ORDER — BISACODYL 10 MG RE SUPP: 10.0000 mg | Freq: Once | RECTAL | Status: DC

## 2018-04-16 NOTE — Progress Notes (Signed)
[facility]  Large BM. Xray with improved bowel gas, and stool in colon. This was prior to the BM.   Tolerating diet. Can go home.  Follow up with PCP. No surgery follow up needed.   Discussed that recurrent SBO is possible but not predictable. Would get same NPO, NG if able, and bowel rest next time prior to any surgery.   Curlene Labrum, MD [facility] [address] [place], [place] [address] [phone] (office)

## 2018-04-16 NOTE — Progress Notes (Signed)
Patient more pleasant this AM. Patient states that he slept from when this RN left his room approximately 2210 until approximately 0515. Almyra Free, NT assisting patient to shower at this time.

## 2018-04-16 NOTE — Progress Notes (Signed)
This RN was doing rounds, so knocked on patient's door and opened it. Patient stated "what in the hell", loudly. Patient was agitated and stated that staff was waking him up every 10 minutes. This RN explained the purpose this time was rounds and to see if he was OK or needed anything. Patient denied needs at this time. Patient's gait is steady, and ambulates independently. Patient encouraged to call if anything is needed, and will be left alone. Almyra Free, NT stated that patient was also irritable. Nira Conn, Agricultural consultant and [place], [place] notified.
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
## 2018-04-16 NOTE — Progress Notes (Signed)
IV removed-clean, dry, intact. Reviewed d/c paperwork with patient and wife. Answered all questions. Walked stable patient to elevator. He and his wife walked out.

## 2018-04-16 NOTE — Progress Notes (Signed)
Patient had a large bowel movement. He tolerated his lunch well which was [place], sweet potatoes, and greens.
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
## 2018-04-20 ENCOUNTER — Other Ambulatory Visit: Payer: Self-pay

## 2018-04-20 ENCOUNTER — Emergency Department (HOSPITAL_COMMUNITY): Payer: Medicare HMO

## 2018-04-20 ENCOUNTER — Encounter (HOSPITAL_COMMUNITY): Payer: Self-pay | Admitting: Emergency Medicine

## 2018-04-20 ENCOUNTER — Inpatient Hospital Stay (HOSPITAL_COMMUNITY)
Admission: EM | Admit: 2018-04-20 | Discharge: 2018-04-21 | DRG: 390 | Disposition: A | Discharge status: Home / Self Care | Payer: Medicare HMO | Attending: Internal Medicine | Admitting: Internal Medicine

## 2018-04-20 DIAGNOSIS — K5651 Intestinal adhesions [bands], with partial obstruction: Principal | ICD-10-CM | POA: Diagnosis present

## 2018-04-20 DIAGNOSIS — Z96641 Presence of right artificial hip joint: Secondary | ICD-10-CM | POA: Diagnosis present

## 2018-04-20 DIAGNOSIS — I10 Essential (primary) hypertension: Secondary | ICD-10-CM | POA: Diagnosis present

## 2018-04-20 DIAGNOSIS — K56609 Unspecified intestinal obstruction, unspecified as to partial versus complete obstruction: Secondary | ICD-10-CM

## 2018-04-20 DIAGNOSIS — Z87891 Personal history of nicotine dependence: Secondary | ICD-10-CM

## 2018-04-20 DIAGNOSIS — M1990 Unspecified osteoarthritis, unspecified site: Secondary | ICD-10-CM | POA: Diagnosis present

## 2018-04-20 DIAGNOSIS — Z79899 Other long term (current) drug therapy: Secondary | ICD-10-CM | POA: Diagnosis not present

## 2018-04-20 DIAGNOSIS — E785 Hyperlipidemia, unspecified: Secondary | ICD-10-CM | POA: Diagnosis present

## 2018-04-20 DIAGNOSIS — E7800 Pure hypercholesterolemia, unspecified: Secondary | ICD-10-CM | POA: Diagnosis present

## 2018-04-20 DIAGNOSIS — K219 Gastro-esophageal reflux disease without esophagitis: Secondary | ICD-10-CM | POA: Diagnosis present

## 2018-04-20 DIAGNOSIS — K565 Intestinal adhesions [bands], unspecified as to partial versus complete obstruction: Secondary | ICD-10-CM

## 2018-04-20 HISTORY — DX: Unspecified intestinal obstruction, unspecified as to partial versus complete obstruction: K56.609

## 2018-04-20 LAB — CBC WITH DIFFERENTIAL/PLATELET
Basophils Absolute: 0 10*3/uL (ref 0.0–0.1)
Basophils Relative: 0 %
EOS PCT: 2 %
Eosinophils Absolute: 0.2 10*3/uL (ref 0.0–0.7)
HCT: 41.7 % (ref 39.0–52.0)
Hemoglobin: 14.2 g/dL (ref 13.0–17.0)
LYMPHS PCT: 13 %
Lymphs Abs: 1.1 10*3/uL (ref 0.7–4.0)
MCH: 31.8 pg (ref 26.0–34.0)
MCHC: 34.1 g/dL (ref 30.0–36.0)
MCV: 93.5 fL (ref 78.0–100.0)
Monocytes Absolute: 0.7 10*3/uL (ref 0.1–1.0)
Monocytes Relative: 8 %
Neutro Abs: 6.4 10*3/uL (ref 1.7–7.7)
Neutrophils Relative %: 77 %
PLATELETS: 227 10*3/uL (ref 150–400)
RBC: 4.46 MIL/uL (ref 4.22–5.81)
RDW: 12.1 % (ref 11.5–15.5)
WBC: 8.3 10*3/uL (ref 4.0–10.5)

## 2018-04-20 LAB — COMPREHENSIVE METABOLIC PANEL
ALBUMIN: 3.8 g/dL (ref 3.5–5.0)
ALT: 16 U/L (ref 0–44)
AST: 15 U/L (ref 15–41)
Alkaline Phosphatase: 72 U/L (ref 38–126)
Anion gap: 8 (ref 5–15)
BUN: 15 mg/dL (ref 8–23)
CO2: 20 mmol/L — ABNORMAL LOW (ref 22–32)
Calcium: 9.5 mg/dL (ref 8.9–10.3)
Chloride: 107 mmol/L (ref 98–111)
Creatinine, Ser: 0.85 mg/dL (ref 0.61–1.24)
GFR calc Af Amer: >60 mL/min (ref >60)
GFR calc non Af Amer: >60 mL/min (ref >60)
GLUCOSE: 104 mg/dL — AB (ref 70–99)
POTASSIUM: 4.2 mmol/L (ref 3.5–5.1)
Sodium: 135 mmol/L (ref 135–145)
Total Bilirubin: 1.1 mg/dL (ref 0.3–1.2)
Total Protein: 6.4 g/dL — ABNORMAL LOW (ref 6.5–8.1)

## 2018-04-20 MED ORDER — ACETAMINOPHEN 650 MG RE SUPP: 650.0000 mg | Freq: Four times a day (QID) | RECTAL | Status: DC | PRN

## 2018-04-20 MED ORDER — SODIUM CHLORIDE 0.9 % IV BOLUS
500.0000 mL | Freq: Once | INTRAVENOUS | Status: AC
  Administered 2018-04-20: 500 mL via INTRAVENOUS

## 2018-04-20 MED ORDER — ONDANSETRON HCL 4 MG/2ML IJ SOLN: 4.0000 mg | Freq: Four times a day (QID) | INTRAMUSCULAR | Status: DC | PRN

## 2018-04-20 MED ORDER — LACTATED RINGERS IV SOLN
INTRAVENOUS | Status: DC
  Administered 2018-04-20: 16:00:00 via INTRAVENOUS

## 2018-04-20 MED ORDER — ONDANSETRON HCL 4 MG/2ML IJ SOLN
4.0000 mg | Freq: Once | INTRAMUSCULAR | Status: AC
  Administered 2018-04-20: 4 mg via INTRAVENOUS
  Filled 2018-04-20: qty 2

## 2018-04-20 MED ORDER — ACETAMINOPHEN 325 MG PO TABS: 650.0000 mg | ORAL_TABLET | Freq: Four times a day (QID) | ORAL | Status: DC | PRN

## 2018-04-20 MED ORDER — ONDANSETRON HCL 4 MG PO TABS: 4.0000 mg | ORAL_TABLET | Freq: Four times a day (QID) | ORAL | Status: DC | PRN

## 2018-04-20 MED ORDER — LISINOPRIL 10 MG PO TABS
10.0000 mg | ORAL_TABLET | Freq: Every day | ORAL | Status: DC
  Administered 2018-04-20 – 2018-04-21 (×2): 10 mg via ORAL
  Filled 2018-04-20 (×3): qty 1

## 2018-04-20 MED ORDER — PANTOPRAZOLE SODIUM 40 MG PO TBEC
40.0000 mg | DELAYED_RELEASE_TABLET | Freq: Every day | ORAL | Status: DC
  Administered 2018-04-21: 40 mg via ORAL
  Filled 2018-04-20 (×3): qty 1

## 2018-04-20 MED ORDER — ENOXAPARIN SODIUM 40 MG/0.4ML ~~LOC~~ SOLN
40.0000 mg | SUBCUTANEOUS | Status: DC
  Filled 2018-04-20: qty 0.4
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
## 2018-04-20 NOTE — Patient Outreach (Signed)
[facility]) Care Management  04/20/2018  HILLIS MCPHATTER 03/17/1946 [id number]  EMMI: general discharge alert Referral date: 04/20/18 Referral reason: knows who to call about changes in condition: NO,  Scheduled follow up: NO  Insurance:  A[employer]na Day # 1 Attempt #1  Telephone call to patient regarding EMMI general discharge red alert.  Contact answering phone states patient is at the hospital.  HIPAA compliant voice message left with call back phone number.   PLAN: RNCM will attempt 2nd telephone call to patient within 4 business days.  RNCM will send outreach letter to attempt contact.   Quinn Plowman RN,BSN,CCM [facility] Telephonic  [phone]

## 2018-04-20 NOTE — ED Triage Notes (Signed)
Pt reports ongoing abd pain since recent bowel obstruction.  States pain was better and became worse again last night.  Diarrhea this morning.

## 2018-04-21 LAB — COMPREHENSIVE METABOLIC PANEL
ALT: 15 U/L (ref 0–44)
AST: 15 U/L (ref 15–41)
Albumin: 3.4 g/dL — ABNORMAL LOW (ref 3.5–5.0)
Alkaline Phosphatase: 61 U/L (ref 38–126)
Anion gap: 6 (ref 5–15)
BUN: 9 mg/dL (ref 8–23)
CO2: 24 mmol/L (ref 22–32)
Calcium: 9.6 mg/dL (ref 8.9–10.3)
Chloride: 108 mmol/L (ref 98–111)
Creatinine, Ser: 0.74 mg/dL (ref 0.61–1.24)
Glucose, Bld: 102 mg/dL — ABNORMAL HIGH (ref 70–99)
POTASSIUM: 4.5 mmol/L (ref 3.5–5.1)
SODIUM: 138 mmol/L (ref 135–145)
Total Bilirubin: 0.8 mg/dL (ref 0.3–1.2)
Total Protein: 5.7 g/dL — ABNORMAL LOW (ref 6.5–8.1)

## 2018-04-21 LAB — CBC
HEMATOCRIT: 39.6 % (ref 39.0–52.0)
HEMOGLOBIN: 13.1 g/dL (ref 13.0–17.0)
MCH: 31.4 pg (ref 26.0–34.0)
MCHC: 33.1 g/dL (ref 30.0–36.0)
MCV: 95 fL (ref 78.0–100.0)
Platelets: 201 10*3/uL (ref 150–400)
RBC: 4.17 MIL/uL — AB (ref 4.22–5.81)
RDW: 12.3 % (ref 11.5–15.5)
WBC: 5.5 10*3/uL (ref 4.0–10.5)
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
## 2018-04-23 ENCOUNTER — Other Ambulatory Visit: Payer: Self-pay

## 2018-04-23 DIAGNOSIS — K660 Peritoneal adhesions (postprocedural) (postinfection): Secondary | ICD-10-CM | POA: Diagnosis not present

## 2018-04-23 DIAGNOSIS — E782 Mixed hyperlipidemia: Secondary | ICD-10-CM | POA: Diagnosis not present

## 2018-04-23 DIAGNOSIS — Z1389 Encounter for screening for other disorder: Secondary | ICD-10-CM | POA: Diagnosis not present

## 2018-04-23 DIAGNOSIS — Z6824 Body mass index (BMI) 24.0-24.9, adult: Secondary | ICD-10-CM | POA: Diagnosis not present

## 2018-04-23 DIAGNOSIS — M159 Polyosteoarthritis, unspecified: Secondary | ICD-10-CM | POA: Diagnosis not present

## 2018-04-23 DIAGNOSIS — K56609 Unspecified intestinal obstruction, unspecified as to partial versus complete obstruction: Secondary | ICD-10-CM | POA: Diagnosis not present

## 2018-04-23 DIAGNOSIS — M1991 Primary osteoarthritis, unspecified site: Secondary | ICD-10-CM | POA: Diagnosis not present

## 2018-04-23 DIAGNOSIS — R109 Unspecified abdominal pain: Secondary | ICD-10-CM | POA: Diagnosis not present

## 2018-04-23 DIAGNOSIS — I10 Essential (primary) hypertension: Secondary | ICD-10-CM | POA: Diagnosis not present

## 2018-04-23 DIAGNOSIS — Z0001 Encounter for general adult medical examination with abnormal findings: Secondary | ICD-10-CM | POA: Diagnosis not present

## 2018-04-23 NOTE — Patient Outreach (Signed)
[facility]) Care Management  04/23/2018  CHANDON LAZCANO [birth year] [id number]  EMMI: general discharge alert Referral date: 04/20/18 Referral reason: knows who to call about changes in condition: NO,  Scheduled follow up: NO  Insurance:  [employer]na Day # 1 Attempt #2  Telephone call to patient regarding EMMI general discharge red alert.  Contact answering phone states patient is at the doctors office.   HIPAA compliant message was eft with contact with call back phone number.   PLAN: RNCM will attempt 3rd telephone call to patient within 4 business days.    Quinn Plowman RN,BSN,CCM [facility] Telephonic  [phone]

## 2018-04-29 ENCOUNTER — Other Ambulatory Visit: Payer: Self-pay

## 2018-04-29 NOTE — Patient Outreach (Signed)
[facility]) Care Management  04/29/2018  Garrett Higgins 12-24-1945 [id number]  EMMI:general discharge alert Referral date:04/20/18 Referral reason:knows who to call about changes in condition: NO, Scheduled follow up: NO  Insurance: Aetna Day #1 Attempt #3  Telephone call to patient regarding EMMI general discharge red alert. Unable to reach patient or leave voice mail message phone only rang.   PLAN:  If no response will proceed with case closure.   Quinn Plowman RN,BSN,CCM [facility] Telephonic  [phone]

## 2018-05-19 ENCOUNTER — Other Ambulatory Visit: Payer: Self-pay

## 2018-05-19 NOTE — Patient Outreach (Signed)
[facility]) Care Management  05/19/2018  NASIRE REALI 1946/08/05 [id number]  EMMI:general discharge alert Referral date:04/20/18 Referral reason:knows who to call about changes in condition: NO, Scheduled follow up: NO  Insurance: Aetna Day #1  No response after 3 telephone calls and outreach letter attempt.  PLAN: RNCM will close patient due to being unable to reach.  RNCM will send closure notification to patient's primary MD   Quinn Plowman RN,BSN,CCM [facility] Telephonic  [phone]

## 2018-06-10 DIAGNOSIS — R69 Illness, unspecified: Secondary | ICD-10-CM | POA: Diagnosis not present

## 2018-06-29 ENCOUNTER — Emergency Department (HOSPITAL_COMMUNITY): Payer: Medicare HMO

## 2018-06-29 ENCOUNTER — Observation Stay (HOSPITAL_COMMUNITY)
Admission: EM | Admit: 2018-06-29 | Discharge: 2018-06-30 | Disposition: A | Discharge status: Home / Self Care | Payer: Medicare HMO | Attending: Physician Assistant | Admitting: Physician Assistant

## 2018-06-29 DIAGNOSIS — R402432 Glasgow coma scale score 3-8, at arrival to emergency department: Secondary | ICD-10-CM | POA: Insufficient documentation

## 2018-06-29 DIAGNOSIS — S199XXA Unspecified injury of neck, initial encounter: Secondary | ICD-10-CM | POA: Diagnosis not present

## 2018-06-29 DIAGNOSIS — F4310 Post-traumatic stress disorder, unspecified: Secondary | ICD-10-CM | POA: Diagnosis not present

## 2018-06-29 DIAGNOSIS — M19012 Primary osteoarthritis, left shoulder: Secondary | ICD-10-CM | POA: Diagnosis not present

## 2018-06-29 DIAGNOSIS — I2510 Atherosclerotic heart disease of native coronary artery without angina pectoris: Secondary | ICD-10-CM | POA: Insufficient documentation

## 2018-06-29 DIAGNOSIS — R69 Illness, unspecified: Secondary | ICD-10-CM | POA: Diagnosis not present

## 2018-06-29 DIAGNOSIS — S069X9A Unspecified intracranial injury with loss of consciousness of unspecified duration, initial encounter: Secondary | ICD-10-CM | POA: Diagnosis not present

## 2018-06-29 DIAGNOSIS — I10 Essential (primary) hypertension: Secondary | ICD-10-CM | POA: Insufficient documentation

## 2018-06-29 DIAGNOSIS — M2578 Osteophyte, vertebrae: Secondary | ICD-10-CM | POA: Insufficient documentation

## 2018-06-29 DIAGNOSIS — S069XAA Unspecified intracranial injury with loss of consciousness status unknown, initial encounter: Secondary | ICD-10-CM | POA: Diagnosis present

## 2018-06-29 DIAGNOSIS — I6523 Occlusion and stenosis of bilateral carotid arteries: Secondary | ICD-10-CM | POA: Diagnosis not present

## 2018-06-29 DIAGNOSIS — S50811A Abrasion of right forearm, initial encounter: Secondary | ICD-10-CM | POA: Diagnosis not present

## 2018-06-29 DIAGNOSIS — M1612 Unilateral primary osteoarthritis, left hip: Secondary | ICD-10-CM | POA: Diagnosis not present

## 2018-06-29 DIAGNOSIS — I700 Atherosclerosis of aorta: Secondary | ICD-10-CM | POA: Diagnosis not present

## 2018-06-29 DIAGNOSIS — R911 Solitary pulmonary nodule: Secondary | ICD-10-CM | POA: Diagnosis not present

## 2018-06-29 DIAGNOSIS — S40811A Abrasion of right upper arm, initial encounter: Secondary | ICD-10-CM | POA: Diagnosis not present

## 2018-06-29 DIAGNOSIS — Z8719 Personal history of other diseases of the digestive system: Secondary | ICD-10-CM | POA: Diagnosis not present

## 2018-06-29 DIAGNOSIS — T148XXA Other injury of unspecified body region, initial encounter: Secondary | ICD-10-CM | POA: Diagnosis not present

## 2018-06-29 DIAGNOSIS — Y9389 Activity, other specified: Secondary | ICD-10-CM | POA: Diagnosis not present

## 2018-06-29 DIAGNOSIS — S3991XA Unspecified injury of abdomen, initial encounter: Secondary | ICD-10-CM | POA: Diagnosis not present

## 2018-06-29 DIAGNOSIS — I672 Cerebral atherosclerosis: Secondary | ICD-10-CM | POA: Diagnosis not present

## 2018-06-29 DIAGNOSIS — S60519A Abrasion of unspecified hand, initial encounter: Secondary | ICD-10-CM | POA: Insufficient documentation

## 2018-06-29 DIAGNOSIS — Y92410 Unspecified street and highway as the place of occurrence of the external cause: Secondary | ICD-10-CM | POA: Diagnosis not present

## 2018-06-29 DIAGNOSIS — S060X1A Concussion with loss of consciousness of 30 minutes or less, initial encounter: Secondary | ICD-10-CM | POA: Diagnosis present

## 2018-06-29 DIAGNOSIS — S3993XA Unspecified injury of pelvis, initial encounter: Secondary | ICD-10-CM | POA: Diagnosis not present

## 2018-06-29 DIAGNOSIS — R000 Tachycardia, unspecified: Secondary | ICD-10-CM | POA: Insufficient documentation

## 2018-06-29 DIAGNOSIS — I4510 Unspecified right bundle-branch block: Secondary | ICD-10-CM | POA: Diagnosis not present

## 2018-06-29 DIAGNOSIS — R933 Abnormal findings on diagnostic imaging of other parts of digestive tract: Secondary | ICD-10-CM | POA: Diagnosis not present

## 2018-06-29 DIAGNOSIS — R4182 Altered mental status, unspecified: Secondary | ICD-10-CM | POA: Diagnosis not present

## 2018-06-29 DIAGNOSIS — Z79899 Other long term (current) drug therapy: Secondary | ICD-10-CM | POA: Insufficient documentation

## 2018-06-29 DIAGNOSIS — T07XXXA Unspecified multiple injuries, initial encounter: Secondary | ICD-10-CM | POA: Diagnosis not present

## 2018-06-29 DIAGNOSIS — S299XXA Unspecified injury of thorax, initial encounter: Secondary | ICD-10-CM | POA: Diagnosis not present

## 2018-06-29 DIAGNOSIS — Z96641 Presence of right artificial hip joint: Secondary | ICD-10-CM | POA: Diagnosis not present

## 2018-06-29 DIAGNOSIS — S062X1A Diffuse traumatic brain injury with loss of consciousness of 30 minutes or less, initial encounter: Secondary | ICD-10-CM | POA: Diagnosis not present

## 2018-06-29 DIAGNOSIS — S069X1A Unspecified intracranial injury with loss of consciousness of 30 minutes or less, initial encounter: Secondary | ICD-10-CM

## 2018-06-29 LAB — COMPREHENSIVE METABOLIC PANEL
ALK PHOS: 60 U/L (ref 38–126)
ALT: 15 U/L (ref 0–44)
AST: 25 U/L (ref 15–41)
Albumin: 3.5 g/dL (ref 3.5–5.0)
Anion gap: 10 (ref 5–15)
BILIRUBIN TOTAL: 1.2 mg/dL (ref 0.3–1.2)
BUN: 13 mg/dL (ref 8–23)
CALCIUM: 8.8 mg/dL — AB (ref 8.9–10.3)
CO2: 19 mmol/L — ABNORMAL LOW (ref 22–32)
CREATININE: 0.99 mg/dL (ref 0.61–1.24)
Chloride: 109 mmol/L (ref 98–111)
GFR calc non Af Amer: >60 mL/min (ref >60)
Glucose, Bld: 108 mg/dL — ABNORMAL HIGH (ref 70–99)
Potassium: 3.3 mmol/L — ABNORMAL LOW (ref 3.5–5.1)
Sodium: 138 mmol/L (ref 135–145)
Total Protein: 5.5 g/dL — ABNORMAL LOW (ref 6.5–8.1)

## 2018-06-29 LAB — CDS SEROLOGY

## 2018-06-29 LAB — CBC
HCT: 37.3 % — ABNORMAL LOW (ref 39.0–52.0)
Hemoglobin: 12.2 g/dL — ABNORMAL LOW (ref 13.0–17.0)
MCH: 32.3 pg (ref 26.0–34.0)
MCHC: 32.7 g/dL (ref 30.0–36.0)
MCV: 98.7 fL (ref 80.0–100.0)
NRBC: 0 % (ref 0.0–0.2)
PLATELETS: 154 10*3/uL (ref 150–400)
RBC: 3.78 MIL/uL — ABNORMAL LOW (ref 4.22–5.81)
RDW: 12.6 % (ref 11.5–15.5)
WBC: 7.2 10*3/uL (ref 4.0–10.5)

## 2018-06-29 LAB — RAPID URINE DRUG SCREEN, HOSP PERFORMED
AMPHETAMINES: NOT DETECTED
Barbiturates: NOT DETECTED
Benzodiazepines: POSITIVE — AB
COCAINE: NOT DETECTED
OPIATES: POSITIVE — AB
Tetrahydrocannabinol: NOT DETECTED

## 2018-06-29 LAB — I-STAT ARTERIAL BLOOD GAS, ED
Bicarbonate: 25 mmol/L (ref 20.0–28.0)
O2 Saturation: 99 %
PH ART: 7.369 (ref 7.350–7.450)
PO2 ART: 160 mmHg — AB (ref 83.0–108.0)
Patient temperature: 98.9
TCO2: 26 mmol/L (ref 22–32)
pCO2 arterial: 43.5 mmHg (ref 32.0–48.0)

## 2018-06-29 LAB — I-STAT CHEM 8, ED
BUN: 15 mg/dL (ref 8–23)
CREATININE: 0.9 mg/dL (ref 0.61–1.24)
Calcium, Ion: 1.17 mmol/L (ref 1.15–1.40)
Chloride: 106 mmol/L (ref 98–111)
GLUCOSE: 110 mg/dL — AB (ref 70–99)
HEMATOCRIT: 35 % — AB (ref 39.0–52.0)
Hemoglobin: 11.9 g/dL — ABNORMAL LOW (ref 13.0–17.0)
POTASSIUM: 3.5 mmol/L (ref 3.5–5.1)
SODIUM: 139 mmol/L (ref 135–145)
TCO2: 22 mmol/L (ref 22–32)

## 2018-06-29 LAB — URINALYSIS, ROUTINE W REFLEX MICROSCOPIC
BILIRUBIN URINE: NEGATIVE
Glucose, UA: NEGATIVE mg/dL
Hgb urine dipstick: NEGATIVE
KETONES UR: NEGATIVE mg/dL
Leukocytes, UA: NEGATIVE
NITRITE: NEGATIVE
PH: 6 (ref 5.0–8.0)
Protein, ur: NEGATIVE mg/dL
Specific Gravity, Urine: 1.036 — ABNORMAL HIGH (ref 1.005–1.030)

## 2018-06-29 LAB — ETHANOL: Alcohol, Ethyl (B): <10 mg/dL (ref <10)

## 2018-06-29 LAB — PREPARE FRESH FROZEN PLASMA
UNIT DIVISION: 0
Unit division: 0

## 2018-06-29 LAB — PROTIME-INR
INR: 0.97
Prothrombin Time: 12.8 seconds (ref 11.4–15.2)

## 2018-06-29 LAB — BPAM FFP
BLOOD PRODUCT EXPIRATION DATE: 201910262359
Blood Product Expiration Date: 201910262359
ISSUE DATE / TIME: 201910211602
ISSUE DATE / TIME: 201910211602
Unit Type and Rh: 600
Unit Type and Rh: 6200

## 2018-06-29 LAB — ABO/RH: ABO/RH(D): AB POS

## 2018-06-29 LAB — I-STAT CG4 LACTIC ACID, ED: Lactic Acid, Venous: 4.29 mmol/L (ref 0.5–1.9)

## 2018-06-29 LAB — MRSA PCR SCREENING: MRSA by PCR: NEGATIVE

## 2018-06-29 LAB — LACTIC ACID, PLASMA: Lactic Acid, Venous: 0.8 mmol/L (ref 0.5–1.9)

## 2018-06-29 MED ORDER — FENTANYL CITRATE (PF) 100 MCG/2ML IJ SOLN
INTRAMUSCULAR | Status: AC | PRN
  Administered 2018-06-29: 100 ug via INTRAVENOUS

## 2018-06-29 MED ORDER — FENTANYL CITRATE (PF) 100 MCG/2ML IJ SOLN: 50.0000 ug | INTRAMUSCULAR | Status: DC | PRN

## 2018-06-29 MED ORDER — SODIUM CHLORIDE 0.9 % IV SOLN: INTRAVENOUS | Status: DC

## 2018-06-29 MED ORDER — ETOMIDATE 2 MG/ML IV SOLN
INTRAVENOUS | Status: AC | PRN
  Administered 2018-06-29: 20 mg via INTRAVENOUS

## 2018-06-29 MED ORDER — SUCCINYLCHOLINE CHLORIDE 20 MG/ML IJ SOLN
INTRAMUSCULAR | Status: AC | PRN
  Administered 2018-06-29: 150 mg via INTRAVENOUS

## 2018-06-29 MED ORDER — FAMOTIDINE IN NACL 20-0.9 MG/50ML-% IV SOLN
20.0000 mg | INTRAVENOUS | Status: DC
  Administered 2018-06-29: 20 mg via INTRAVENOUS
  Filled 2018-06-29: qty 50

## 2018-06-29 MED ORDER — ENOXAPARIN SODIUM 40 MG/0.4ML ~~LOC~~ SOLN: 40.0000 mg | SUBCUTANEOUS | Status: DC

## 2018-06-29 MED ORDER — MIDAZOLAM HCL 2 MG/2ML IJ SOLN: 2.0000 mg | INTRAMUSCULAR | Status: DC | PRN

## 2018-06-29 MED ORDER — IOHEXOL 300 MG/ML  SOLN
100.0000 mL | Freq: Once | INTRAMUSCULAR | Status: AC | PRN
  Administered 2018-06-29: 100 mL via INTRAVENOUS

## 2018-06-29 MED ORDER — FENTANYL CITRATE (PF) 100 MCG/2ML IJ SOLN
INTRAMUSCULAR | Status: AC
  Filled 2018-06-29: qty 2
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
## 2018-06-29 NOTE — ED Notes (Signed)
Pt. Family escorted to consultation A. Chaplin called by [facility], sec.

## 2018-06-29 NOTE — ED Notes (Signed)
Pt awake , family at bedside after extubation

## 2018-06-29 NOTE — Progress Notes (Signed)
Placed patient on vent per MD order. And transported to CT and back. ABG obtained. No noted respiratory issues at this time. Patient with no spontaneous respirations at this time. Unable to wean currently.
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
## 2018-06-29 NOTE — ED Notes (Signed)
Attempted report 

## 2018-06-29 NOTE — ED Notes (Signed)
Pt intubated , 7.5 et tube , confirmed with color change  And chest x ray , secured 24 at the lip
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
## 2018-06-29 NOTE — Procedures (Signed)
Extubation Procedure Note  Patient Details:   Name: Garrett Higgins DOB: [birth year] MRN: [id number]   Airway Documentation:    Vent end date: 06/29/18 Vent end time: 1758   Evaluation  O2 sats: stable throughout Complications: No apparent complications Patient did tolerate procedure well. Bilateral Breath Sounds: Diminished   Yes  Rudene Re 06/29/2018, 5:59 PM
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
## 2018-06-30 DIAGNOSIS — T07XXXA Unspecified multiple injuries, initial encounter: Secondary | ICD-10-CM | POA: Diagnosis not present

## 2018-06-30 DIAGNOSIS — R4182 Altered mental status, unspecified: Secondary | ICD-10-CM | POA: Diagnosis not present

## 2018-06-30 DIAGNOSIS — S060X1A Concussion with loss of consciousness of 30 minutes or less, initial encounter: Secondary | ICD-10-CM | POA: Diagnosis not present

## 2018-06-30 LAB — TYPE AND SCREEN
ABO/RH(D): AB POS
Antibody Screen: NEGATIVE
UNIT DIVISION: 0
Unit division: 0

## 2018-06-30 LAB — BASIC METABOLIC PANEL
Anion gap: 4 — ABNORMAL LOW (ref 5–15)
BUN: 8 mg/dL (ref 8–23)
CALCIUM: 9.3 mg/dL (ref 8.9–10.3)
CO2: 22 mmol/L (ref 22–32)
CREATININE: 0.85 mg/dL (ref 0.61–1.24)
Chloride: 112 mmol/L — ABNORMAL HIGH (ref 98–111)
GFR calc Af Amer: >60 mL/min (ref >60)
GFR calc non Af Amer: >60 mL/min (ref >60)
GLUCOSE: 96 mg/dL (ref 70–99)
Potassium: 3.4 mmol/L — ABNORMAL LOW (ref 3.5–5.1)
Sodium: 138 mmol/L (ref 135–145)

## 2018-06-30 LAB — CBC
HCT: 35.5 % — ABNORMAL LOW (ref 39.0–52.0)
Hemoglobin: 11.9 g/dL — ABNORMAL LOW (ref 13.0–17.0)
MCH: 32.2 pg (ref 26.0–34.0)
MCHC: 33.5 g/dL (ref 30.0–36.0)
MCV: 95.9 fL (ref 80.0–100.0)
Platelets: 144 10*3/uL — ABNORMAL LOW (ref 150–400)
RBC: 3.7 MIL/uL — ABNORMAL LOW (ref 4.22–5.81)
RDW: 12.8 % (ref 11.5–15.5)
WBC: 4.5 10*3/uL (ref 4.0–10.5)
nRBC: 0 % (ref 0.0–0.2)

## 2018-06-30 LAB — BPAM RBC
Blood Product Expiration Date: 201911232359
Blood Product Expiration Date: 201911232359
ISSUE DATE / TIME: 201910211845
ISSUE DATE / TIME: 201910211602
UNIT TYPE AND RH: 5100
Unit Type and Rh: 5100

## 2018-06-30 MED ORDER — DOCUSATE SODIUM 100 MG PO CAPS
100.0000 mg | ORAL_CAPSULE | Freq: Two times a day (BID) | ORAL | Status: DC
  Administered 2018-06-30: 100 mg via ORAL
  Filled 2018-06-30: qty 1

## 2018-06-30 MED ORDER — HYDROCODONE-ACETAMINOPHEN 10-325 MG PO TABS: 1.0000 | ORAL_TABLET | Freq: Four times a day (QID) | ORAL | Status: DC | PRN

## 2018-06-30 MED ORDER — LISINOPRIL 10 MG PO TABS
10.0000 mg | ORAL_TABLET | Freq: Every day | ORAL | Status: DC
  Administered 2018-06-30: 10 mg via ORAL
  Filled 2018-06-30: qty 1

## 2018-06-30 MED ORDER — BUTALBITAL-APAP-CAFFEINE 50-325-40 MG PO TABS: 1.0000 | ORAL_TABLET | Freq: Three times a day (TID) | ORAL | Status: DC | PRN

## 2018-06-30 MED ORDER — DICLOFENAC SODIUM 75 MG PO TBEC
75.0000 mg | DELAYED_RELEASE_TABLET | Freq: Two times a day (BID) | ORAL | Status: DC
  Administered 2018-06-30: 75 mg via ORAL
  Filled 2018-06-30: qty 1

## 2018-06-30 MED ORDER — ROSUVASTATIN CALCIUM 20 MG PO TABS: 40.0000 mg | ORAL_TABLET | Freq: Every day | ORAL | Status: DC
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
## 2018-06-30 NOTE — Progress Notes (Addendum)
Pt discharged via wheelchair. Wife at side expressing concern. Patient very agitated. All /discharge/education material was left in room by patient.
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
## 2018-06-30 NOTE — Discharge Instructions (Signed)
Returning to Sports and Activities After a Concussion, Adult A concussion is a brain injury from a direct hit (blow) to the head or body. This blow causes the brain to shake quickly back and forth inside the skull. This can damage brain cells and cause chemical changes in the brain. Anyone can have a concussion. It is common to get a concussion while playing sports or doing athletic activities. Concussions can also happen outside of sports, such as falling and hitting your head. If you have a concussion, you may have temporary problems with brain functions that involve memory, speech, balance, and coordination. You also may also feel dizzy or nauseous. You may have trouble thinking clearly. Symptoms usually go away in a couple of weeks. Sometimes they last longer. It is important to wait to return to activity until your symptoms are completely gone and a health care provider says it is safe to do so. You may also need to take time away from work or other activities that require concentration, depending on how severe your concussion is. Going back too soon increases the risk of another concussion. Concussions can have serious effects on your brain. People who have more than one concussion are at greater risk of having chronic headaches and problems with learning. When can I return to sports or other activities? You should stop participating in an activity immediately after you hit your head or a concussion is suspected. You need to rest physically and mentally. You should also be monitored carefully by another adult. How quickly you can return to sports and other activities depends on:  Your age.  The severity of your concussion.  Your health before the injury.  Whether you have had a previous concussion.  How should I gradually return to sports or other activities? You should not resume your sports or activities until you are symptom-free without medicine for at least 24 hours. Your health care  provider will determine when your symptoms are completely gone and when it is safe for you to practice and play sports again. It is important that you return to sports gradually. Do not try to do too much too soon. Gradually advance through the following activity levels to return to sports: 1. Begin with only light aerobic activity to increase your heart rate. You may bike, walk, or jog for up to 10 minutes. Do not jump, run, or lift weights. 2. Get moderate physical activity with some head and body movements. Running short distances, fast jogging, using a stationary bike, and moderate-intensity weight lifting are okay. 3. Participate in high-intensity exercise without physical contact. 4. Return to your normal practice routine, which may include full contact. 5. Return to play in games, matches, or other competitions.  Some people can progress quickly through these levels. Other people will need several days to go from one level to the next. Do not move on to the next level until you have been symptom-free for at least 24 hours following the previous level. Symptoms to watch for include:  Fatigue.  Headache.  Problems with balance, coordination, or memory.  If you notice any of these warning signs during exercise or other physical activities, rest for at least 24 hours or until the symptoms go away. You can then resume activity. Start at the activity level that you were on before your symptoms began. What symptoms are important to report to my health care provider? Concussion symptoms may not appear right away. They could also get worse at any time. It  is important to let your health care provider know if you have any new or worsening symptoms, such as:  Drowsiness or fatigue.  Severe or persistent headache.  Memory loss.  Confusion.  Dizziness.  Trouble concentrating.  Nausea and vomiting.  Loss of consciousness.  Weakness or numbness.  Problems with balance and  coordination.  Slurred speech.  Seizures.  Trouble recognizing faces or places.  Irritability.  Changes in sleep habits.  Depression.  Personality changes.  Inability to remember events before or after the injury.  Certain health issues may make your recovery from a concussion take longer. Let your health care provider know if you have a history of:  Migraines.  Depression.  Mood disorders.  Anxiety.  A developmental disorder or a brain-related (neurobehavioral) disorder, such as ADHD.  What are some questions to ask my health care provider? When you have a concussion, learning as much as you can about your injury can help you protect your long-term health. Ask your health care provider the following questions:  Is it safe for me to return to sports or other physical activities?  What are the short-term and long-term consequences of my injury?  Should I consider not playing sports anymore?  What happens if I get another concussion?  What are the warning signs of a concussion?  Could I have a concussion without knowing it?  When should I go to the emergency room?  How can I prevent another concussion from happening?  How might the concussion affect my professional life?  Should I limit how much time I watch TV, play video games, or use a computer?  Do I need to take time away from work?  Do I need more sleep than normal?  Do I need medicine for a concussion?  Could I have problems with memory or learning?  This information is not intended to replace advice given to you by your health care provider. Make sure you discuss any questions you have with your health care provider. Document Released: 12/18/2015 Document Revised: 02/01/2016 Document Reviewed: 09/10/2015 Elsevier Interactive Patient Education  2018 [employer].   Traumatic Brain Injury Traumatic brain injury (TBI) is an injury to your brain from a blow to your head (closed injury) or an  object penetrating your skull and entering your brain (open injury). The severity of TBI varies significantly from one person to the next. Some TBIs cause you to pass out (lose consciousness) immediately and for a long period of time. Other TBIs do not cause any loss of consciousness. Symptoms of any type of TBI can be long lasting (chronic). TBI can interfere with memory and speech. TBI can also cause chronic symptoms like headache or dizziness. What are the causes? TBI is caused by a closed or open injury. What increases the risk? You may be at higher risk for TBI if you:  Are 75 or older.  Are a man.  Are in a car accident.  Play a contact sport, especially football, hockey, or soccer.  Do not wear protective gear while playing sports.  Are in the [employer].  Are a victim of violence.  Abuse drugs or alcohol.  Have had a previous TBI.  What are the signs or symptoms? Signs and symptoms of TBI may occur right away or not until days, weeks, or months after the injury. They may last for days, weeks, months, or years. Symptoms may include:  Loss of consciousness.  Headache.  Confusion.  Fatigue.  Changes in sleep.  Dizziness.  Mood or personality changes.  Memory problems.  Nausea or vomiting or both.  Seizures.  Clumsiness.  Slurred speech.  Depression and anxiety.  Anger.  Inability to control emotions or actions (impulse control).  Loss of or dulling of your senses, such as hearing, vision, and touch. This can include: ? Blurred vision. ? Ringing in your ears.  How is this diagnosed? TBI may be diagnosed by a medical history and physical exam. Your health care provider will also do a neurologic exam to check your:  Reflexes.  Sensations.  Alertness.  Memory.  Vision.  Hearing.  Coordination.  Your health care provider will also do tests to diagnose the extent of your TBI, such as a CT scan of your brain and skull. One way to determine  the severity of your TBI is with a scoring system called the Glasgow Coma Scale (GCS). It measures eye opening, motor response, and verbal response. The higher the score, the milder the TBI. Your TBI may be described as mild, moderate, or severe:  Mild TBI (concussion). ? Symptoms of mild TBI usually go away on their own. This can take weeks or months, depending on the type of concussion. ? Your GCS will be 13-15. ? Your brain CT scan will be normal. ? You may or may not have a short hospital stay.  Moderate TBI. ? Your GCS will be 9-12. ? Your brain CT scan will be abnormal. ? You will likely need a short hospital stay.  Severe TBI. ? Your GCS will be 3-8. ? Your brain CT scan will be abnormal. ? You may need a long stay in the hospital.  How is this treated? Emergency treatment of TBI may involve measures to maintain a clear airway and stable blood pressure. Brain surgery may be needed to:  Remove a blood clot.  Repair bleeding.  Remove an object that has penetrated the brain, such as a skull fragment or a bullet.  Other treatments depend on your chronic signs and symptoms. These treatments include the following types of therapy:  Physical.  Occupational.  Speech and language.  Mental health.  Social support.  Follow these instructions at home:  Carefully follow all your health care provider's instructions.  Work closely with all your therapists, if necessary.  Take medicines only as directed by your health care provider. Do not take aspirin or other anti-inflammatory medicines such as ibuprofen or naproxen unless approved by your health care provider.  Do not abuse illegal drugs.  Limit alcohol intake to no more than 1 drink per day for nonpregnant women and 2 drinks per day for men. One drink equals 12 ounces of beer, 5 ounces of wine, or 1 ounces of hard liquor.  Avoid any situation where there is potential for another head injury, such as football, hockey,  soccer, basketball, martial arts, downhill snow sports, and horseback riding. Do not do these activities until your health care provider approves.  Rest. Rest helps the brain to heal. Make sure you: ? Get plenty of sleep at night. Avoid staying up late at night. ? Keep the same bedtime hours on weekends and weekdays. ? Rest during the day. Take daytime naps or rest breaks when you feel tired.  Avoid excessive visual stimulation while recovering from a TBI. This includes work on the computer, watching TV, and reading.  Try to avoid activities that cause physical or mental stress. Stay home from work or school as directed by your health care provider.  Make lists  to plan your day and help your memory.  Do not drive, ride a bicycle, or operate heavy machinery until your health care provider approves.  Seek support from friends and family.  Keep all follow-up visits as directed by your health care provider. This is important.  Watch your symptoms and tell others to do the same. Complications sometimes occur after a TBI. How is this prevented?  Wear a helmet while biking, skiing, skateboarding, skating, or doing similar activities. Wear your seatbelt while driving.  Do not abuse alcohol or drugs.  Do not drink and drive.  Prevent falls at home by: ? Removing clutter and tripping hazards, including loose rugs, from floors and stairways. ? Using grab bars in bathrooms and handrails by stairs. ? Placing nonslip mats on floors and in bathtubs. ? Improving lighting in dim areas. Contact a health care provider if: Seek medical care if you have any of the following symptoms for more than two weeks after your injury:  Chronic headaches.  Dizziness or balance problems.  Nausea.  Vision problems.  Increased sensitivity to noise or light.  Depression or mood swings.  Anxiety or irritability.  Memory problems.  Difficulty concentrating or paying attention.  Sleep  problems.  Feeling tired all the time.  Get help right away if:  You have confusion or unusual drowsiness.  It is difficult to wake you up.  You have nausea or persistent, forceful vomiting.  You feel like you are moving when you are not (vertigo). Your eyes may move rapidly back and forth.  You have convulsions or faint.  You have severe, persistent headaches that are not relieved by medicine.  You cannot use your arms or legs normally.  One of your pupils is larger than the other.  You have clear or bloody discharge from your nose or ears.  Your problems are getting worse, not better. This information is not intended to replace advice given to you by your health care provider. Make sure you discuss any questions you have with your health care provider. Document Released: 08/16/2002 Document Revised: 04/28/2016 Document Reviewed: 12/09/2013 Elsevier Interactive Patient Education  [employer].

## 2018-07-02 DIAGNOSIS — S0990XA Unspecified injury of head, initial encounter: Secondary | ICD-10-CM | POA: Diagnosis not present

## 2018-07-02 DIAGNOSIS — I10 Essential (primary) hypertension: Secondary | ICD-10-CM | POA: Diagnosis not present

## 2018-07-02 DIAGNOSIS — S060X9A Concussion with loss of consciousness of unspecified duration, initial encounter: Secondary | ICD-10-CM | POA: Diagnosis not present

## 2018-07-02 DIAGNOSIS — Z6824 Body mass index (BMI) 24.0-24.9, adult: Secondary | ICD-10-CM | POA: Diagnosis not present

## 2018-07-02 DIAGNOSIS — R561 Post traumatic seizures: Secondary | ICD-10-CM | POA: Diagnosis not present

## 2018-10-28 DIAGNOSIS — I10 Essential (primary) hypertension: Secondary | ICD-10-CM | POA: Diagnosis not present

## 2018-10-28 DIAGNOSIS — Z1389 Encounter for screening for other disorder: Secondary | ICD-10-CM | POA: Diagnosis not present

## 2018-10-28 DIAGNOSIS — G894 Chronic pain syndrome: Secondary | ICD-10-CM | POA: Diagnosis not present

## 2018-10-28 DIAGNOSIS — H6010 Cellulitis of external ear, unspecified ear: Secondary | ICD-10-CM | POA: Diagnosis not present

## 2018-10-28 DIAGNOSIS — Z6826 Body mass index (BMI) 26.0-26.9, adult: Secondary | ICD-10-CM | POA: Diagnosis not present

## 2018-10-28 DIAGNOSIS — M1991 Primary osteoarthritis, unspecified site: Secondary | ICD-10-CM | POA: Diagnosis not present

## 2018-11-05 DIAGNOSIS — L570 Actinic keratosis: Secondary | ICD-10-CM | POA: Diagnosis not present

## 2018-11-05 DIAGNOSIS — X32XXXA Exposure to sunlight, initial encounter: Secondary | ICD-10-CM | POA: Diagnosis not present

## 2018-11-25 DIAGNOSIS — M159 Polyosteoarthritis, unspecified: Secondary | ICD-10-CM | POA: Diagnosis not present

## 2018-11-25 DIAGNOSIS — E663 Overweight: Secondary | ICD-10-CM | POA: Diagnosis not present

## 2018-11-25 DIAGNOSIS — Z6826 Body mass index (BMI) 26.0-26.9, adult: Secondary | ICD-10-CM | POA: Diagnosis not present

## 2018-12-24 DIAGNOSIS — G894 Chronic pain syndrome: Secondary | ICD-10-CM | POA: Diagnosis not present

## 2018-12-24 DIAGNOSIS — M1991 Primary osteoarthritis, unspecified site: Secondary | ICD-10-CM | POA: Diagnosis not present

## 2018-12-24 DIAGNOSIS — E663 Overweight: Secondary | ICD-10-CM | POA: Diagnosis not present

## 2018-12-24 DIAGNOSIS — Z6825 Body mass index (BMI) 25.0-25.9, adult: Secondary | ICD-10-CM | POA: Diagnosis not present
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
## 2019-01-26 DIAGNOSIS — Z6825 Body mass index (BMI) 25.0-25.9, adult: Secondary | ICD-10-CM | POA: Diagnosis not present

## 2019-01-26 DIAGNOSIS — I10 Essential (primary) hypertension: Secondary | ICD-10-CM | POA: Diagnosis not present

## 2019-01-26 DIAGNOSIS — G894 Chronic pain syndrome: Secondary | ICD-10-CM | POA: Diagnosis not present

## 2019-01-26 DIAGNOSIS — Z23 Encounter for immunization: Secondary | ICD-10-CM | POA: Diagnosis not present

## 2019-01-26 DIAGNOSIS — M1991 Primary osteoarthritis, unspecified site: Secondary | ICD-10-CM | POA: Diagnosis not present

## 2019-03-29 DIAGNOSIS — E663 Overweight: Secondary | ICD-10-CM | POA: Diagnosis not present

## 2019-03-29 DIAGNOSIS — Z1389 Encounter for screening for other disorder: Secondary | ICD-10-CM | POA: Diagnosis not present

## 2019-03-29 DIAGNOSIS — G894 Chronic pain syndrome: Secondary | ICD-10-CM | POA: Diagnosis not present

## 2019-03-29 DIAGNOSIS — Z6825 Body mass index (BMI) 25.0-25.9, adult: Secondary | ICD-10-CM | POA: Diagnosis not present

## 2019-05-24 DIAGNOSIS — Z681 Body mass index (BMI) 19 or less, adult: Secondary | ICD-10-CM | POA: Diagnosis not present

## 2019-05-24 DIAGNOSIS — G894 Chronic pain syndrome: Secondary | ICD-10-CM | POA: Diagnosis not present

## 2019-05-24 DIAGNOSIS — N529 Male erectile dysfunction, unspecified: Secondary | ICD-10-CM | POA: Diagnosis not present

## 2019-05-24 DIAGNOSIS — Z0000 Encounter for general adult medical examination without abnormal findings: Secondary | ICD-10-CM | POA: Diagnosis not present

## 2019-05-24 DIAGNOSIS — Z1389 Encounter for screening for other disorder: Secondary | ICD-10-CM | POA: Diagnosis not present

## 2019-06-07 DIAGNOSIS — R69 Illness, unspecified: Secondary | ICD-10-CM | POA: Diagnosis not present

## 2019-07-12 DIAGNOSIS — Z1389 Encounter for screening for other disorder: Secondary | ICD-10-CM | POA: Diagnosis not present

## 2019-07-12 DIAGNOSIS — Z0000 Encounter for general adult medical examination without abnormal findings: Secondary | ICD-10-CM | POA: Diagnosis not present

## 2019-07-12 DIAGNOSIS — G894 Chronic pain syndrome: Secondary | ICD-10-CM | POA: Diagnosis not present

## 2019-07-12 DIAGNOSIS — E7849 Other hyperlipidemia: Secondary | ICD-10-CM | POA: Diagnosis not present

## 2019-07-22 DIAGNOSIS — Z6826 Body mass index (BMI) 26.0-26.9, adult: Secondary | ICD-10-CM | POA: Diagnosis not present

## 2019-07-22 DIAGNOSIS — M159 Polyosteoarthritis, unspecified: Secondary | ICD-10-CM | POA: Diagnosis not present

## 2019-07-22 DIAGNOSIS — G894 Chronic pain syndrome: Secondary | ICD-10-CM | POA: Diagnosis not present

## 2019-07-22 DIAGNOSIS — E663 Overweight: Secondary | ICD-10-CM | POA: Diagnosis not present

## 2019-09-21 DIAGNOSIS — E663 Overweight: Secondary | ICD-10-CM | POA: Diagnosis not present

## 2019-09-21 DIAGNOSIS — G894 Chronic pain syndrome: Secondary | ICD-10-CM | POA: Diagnosis not present

## 2019-09-21 DIAGNOSIS — Z6826 Body mass index (BMI) 26.0-26.9, adult: Secondary | ICD-10-CM | POA: Diagnosis not present

## 2019-09-21 DIAGNOSIS — Z1389 Encounter for screening for other disorder: Secondary | ICD-10-CM | POA: Diagnosis not present

## 2019-11-22 DIAGNOSIS — Z6826 Body mass index (BMI) 26.0-26.9, adult: Secondary | ICD-10-CM | POA: Diagnosis not present

## 2019-11-22 DIAGNOSIS — E663 Overweight: Secondary | ICD-10-CM | POA: Diagnosis not present

## 2019-11-22 DIAGNOSIS — G894 Chronic pain syndrome: Secondary | ICD-10-CM | POA: Diagnosis not present

## 2019-11-22 DIAGNOSIS — Z1389 Encounter for screening for other disorder: Secondary | ICD-10-CM | POA: Diagnosis not present

## 2019-11-22 DIAGNOSIS — N529 Male erectile dysfunction, unspecified: Secondary | ICD-10-CM | POA: Diagnosis not present

## 2019-12-23 DIAGNOSIS — Z6826 Body mass index (BMI) 26.0-26.9, adult: Secondary | ICD-10-CM | POA: Diagnosis not present

## 2019-12-23 DIAGNOSIS — E663 Overweight: Secondary | ICD-10-CM | POA: Diagnosis not present

## 2019-12-23 DIAGNOSIS — G894 Chronic pain syndrome: Secondary | ICD-10-CM | POA: Diagnosis not present

## 2020-01-07 DIAGNOSIS — R69 Illness, unspecified: Secondary | ICD-10-CM | POA: Diagnosis not present

## 2020-01-07 DIAGNOSIS — M159 Polyosteoarthritis, unspecified: Secondary | ICD-10-CM | POA: Diagnosis not present

## 2020-01-07 DIAGNOSIS — I10 Essential (primary) hypertension: Secondary | ICD-10-CM | POA: Diagnosis not present

## 2020-01-07 DIAGNOSIS — G894 Chronic pain syndrome: Secondary | ICD-10-CM | POA: Diagnosis not present

## 2020-01-20 DIAGNOSIS — M159 Polyosteoarthritis, unspecified: Secondary | ICD-10-CM | POA: Diagnosis not present

## 2020-01-20 DIAGNOSIS — G894 Chronic pain syndrome: Secondary | ICD-10-CM | POA: Diagnosis not present

## 2020-01-20 DIAGNOSIS — Z6826 Body mass index (BMI) 26.0-26.9, adult: Secondary | ICD-10-CM | POA: Diagnosis not present

## 2020-01-20 DIAGNOSIS — E663 Overweight: Secondary | ICD-10-CM | POA: Diagnosis not present

## 2020-02-21 DIAGNOSIS — E663 Overweight: Secondary | ICD-10-CM | POA: Diagnosis not present

## 2020-02-21 DIAGNOSIS — Z6826 Body mass index (BMI) 26.0-26.9, adult: Secondary | ICD-10-CM | POA: Diagnosis not present

## 2020-02-21 DIAGNOSIS — G894 Chronic pain syndrome: Secondary | ICD-10-CM | POA: Diagnosis not present

## 2020-03-08 DIAGNOSIS — M159 Polyosteoarthritis, unspecified: Secondary | ICD-10-CM | POA: Diagnosis not present

## 2020-03-08 DIAGNOSIS — I10 Essential (primary) hypertension: Secondary | ICD-10-CM | POA: Diagnosis not present

## 2020-03-08 DIAGNOSIS — G894 Chronic pain syndrome: Secondary | ICD-10-CM | POA: Diagnosis not present

## 2020-03-08 DIAGNOSIS — R69 Illness, unspecified: Secondary | ICD-10-CM | POA: Diagnosis not present

## 2020-03-08 DIAGNOSIS — E7849 Other hyperlipidemia: Secondary | ICD-10-CM | POA: Diagnosis not present

## 2020-03-22 DIAGNOSIS — G894 Chronic pain syndrome: Secondary | ICD-10-CM | POA: Diagnosis not present

## 2020-03-22 DIAGNOSIS — Z681 Body mass index (BMI) 19 or less, adult: Secondary | ICD-10-CM | POA: Diagnosis not present

## 2020-03-22 DIAGNOSIS — M159 Polyosteoarthritis, unspecified: Secondary | ICD-10-CM | POA: Diagnosis not present

## 2020-04-07 DIAGNOSIS — E1140 Type 2 diabetes mellitus with diabetic neuropathy, unspecified: Secondary | ICD-10-CM | POA: Diagnosis not present

## 2020-04-07 DIAGNOSIS — E7849 Other hyperlipidemia: Secondary | ICD-10-CM | POA: Diagnosis not present

## 2020-04-07 DIAGNOSIS — I10 Essential (primary) hypertension: Secondary | ICD-10-CM | POA: Diagnosis not present

## 2020-04-07 DIAGNOSIS — I480 Paroxysmal atrial fibrillation: Secondary | ICD-10-CM | POA: Diagnosis not present

## 2020-04-20 DIAGNOSIS — Z6826 Body mass index (BMI) 26.0-26.9, adult: Secondary | ICD-10-CM | POA: Diagnosis not present

## 2020-04-20 DIAGNOSIS — E663 Overweight: Secondary | ICD-10-CM | POA: Diagnosis not present

## 2020-04-20 DIAGNOSIS — G894 Chronic pain syndrome: Secondary | ICD-10-CM | POA: Diagnosis not present

## 2020-05-19 DIAGNOSIS — I10 Essential (primary) hypertension: Secondary | ICD-10-CM | POA: Diagnosis not present

## 2020-05-19 DIAGNOSIS — Z681 Body mass index (BMI) 19 or less, adult: Secondary | ICD-10-CM | POA: Diagnosis not present

## 2020-05-19 DIAGNOSIS — G894 Chronic pain syndrome: Secondary | ICD-10-CM | POA: Diagnosis not present

## 2020-05-19 DIAGNOSIS — M1991 Primary osteoarthritis, unspecified site: Secondary | ICD-10-CM | POA: Diagnosis not present

## 2020-05-19 DIAGNOSIS — K219 Gastro-esophageal reflux disease without esophagitis: Secondary | ICD-10-CM | POA: Diagnosis not present

## 2020-06-08 DIAGNOSIS — I10 Essential (primary) hypertension: Secondary | ICD-10-CM | POA: Diagnosis not present

## 2020-06-08 DIAGNOSIS — M1991 Primary osteoarthritis, unspecified site: Secondary | ICD-10-CM | POA: Diagnosis not present

## 2020-06-09 DIAGNOSIS — R69 Illness, unspecified: Secondary | ICD-10-CM | POA: Diagnosis not present

## 2020-06-16 DIAGNOSIS — M1991 Primary osteoarthritis, unspecified site: Secondary | ICD-10-CM | POA: Diagnosis not present

## 2020-06-16 DIAGNOSIS — Z23 Encounter for immunization: Secondary | ICD-10-CM | POA: Diagnosis not present

## 2020-06-16 DIAGNOSIS — Z125 Encounter for screening for malignant neoplasm of prostate: Secondary | ICD-10-CM | POA: Diagnosis not present

## 2020-06-16 DIAGNOSIS — Z0001 Encounter for general adult medical examination with abnormal findings: Secondary | ICD-10-CM | POA: Diagnosis not present

## 2020-06-16 DIAGNOSIS — E7849 Other hyperlipidemia: Secondary | ICD-10-CM | POA: Diagnosis not present

## 2020-06-16 DIAGNOSIS — Z1389 Encounter for screening for other disorder: Secondary | ICD-10-CM | POA: Diagnosis not present

## 2020-06-16 DIAGNOSIS — E782 Mixed hyperlipidemia: Secondary | ICD-10-CM | POA: Diagnosis not present

## 2020-06-16 DIAGNOSIS — K219 Gastro-esophageal reflux disease without esophagitis: Secondary | ICD-10-CM | POA: Diagnosis not present

## 2020-06-16 DIAGNOSIS — G894 Chronic pain syndrome: Secondary | ICD-10-CM | POA: Diagnosis not present

## 2020-06-16 DIAGNOSIS — K5669 Other partial intestinal obstruction: Secondary | ICD-10-CM | POA: Diagnosis not present

## 2020-06-16 DIAGNOSIS — I700 Atherosclerosis of aorta: Secondary | ICD-10-CM | POA: Diagnosis not present

## 2020-06-16 DIAGNOSIS — I10 Essential (primary) hypertension: Secondary | ICD-10-CM | POA: Diagnosis not present

## 2020-06-16 DIAGNOSIS — Z6826 Body mass index (BMI) 26.0-26.9, adult: Secondary | ICD-10-CM | POA: Diagnosis not present

## 2020-06-16 DIAGNOSIS — N529 Male erectile dysfunction, unspecified: Secondary | ICD-10-CM | POA: Diagnosis not present

## 2020-06-16 DIAGNOSIS — E039 Hypothyroidism, unspecified: Secondary | ICD-10-CM | POA: Diagnosis not present

## 2020-07-08 DIAGNOSIS — M159 Polyosteoarthritis, unspecified: Secondary | ICD-10-CM | POA: Diagnosis not present

## 2020-07-08 DIAGNOSIS — I10 Essential (primary) hypertension: Secondary | ICD-10-CM | POA: Diagnosis not present

## 2020-07-08 DIAGNOSIS — E7849 Other hyperlipidemia: Secondary | ICD-10-CM | POA: Diagnosis not present

## 2020-07-08 DIAGNOSIS — K219 Gastro-esophageal reflux disease without esophagitis: Secondary | ICD-10-CM | POA: Diagnosis not present

## 2020-07-20 DIAGNOSIS — M1991 Primary osteoarthritis, unspecified site: Secondary | ICD-10-CM | POA: Diagnosis not present

## 2020-07-20 DIAGNOSIS — G894 Chronic pain syndrome: Secondary | ICD-10-CM | POA: Diagnosis not present

## 2020-07-20 DIAGNOSIS — Z6826 Body mass index (BMI) 26.0-26.9, adult: Secondary | ICD-10-CM | POA: Diagnosis not present

## 2020-07-20 DIAGNOSIS — K219 Gastro-esophageal reflux disease without esophagitis: Secondary | ICD-10-CM | POA: Diagnosis not present

## 2020-09-08 DIAGNOSIS — I10 Essential (primary) hypertension: Secondary | ICD-10-CM | POA: Diagnosis not present

## 2020-09-08 DIAGNOSIS — M159 Polyosteoarthritis, unspecified: Secondary | ICD-10-CM | POA: Diagnosis not present

## 2020-09-08 DIAGNOSIS — K219 Gastro-esophageal reflux disease without esophagitis: Secondary | ICD-10-CM | POA: Diagnosis not present

## 2020-09-08 DIAGNOSIS — E785 Hyperlipidemia, unspecified: Secondary | ICD-10-CM | POA: Diagnosis not present

## 2020-10-07 DIAGNOSIS — K219 Gastro-esophageal reflux disease without esophagitis: Secondary | ICD-10-CM | POA: Diagnosis not present

## 2020-10-07 DIAGNOSIS — M159 Polyosteoarthritis, unspecified: Secondary | ICD-10-CM | POA: Diagnosis not present

## 2020-10-07 DIAGNOSIS — E785 Hyperlipidemia, unspecified: Secondary | ICD-10-CM | POA: Diagnosis not present

## 2020-10-07 DIAGNOSIS — I10 Essential (primary) hypertension: Secondary | ICD-10-CM | POA: Diagnosis not present

## 2020-10-19 DIAGNOSIS — I10 Essential (primary) hypertension: Secondary | ICD-10-CM | POA: Diagnosis not present

## 2020-10-19 DIAGNOSIS — Z6827 Body mass index (BMI) 27.0-27.9, adult: Secondary | ICD-10-CM | POA: Diagnosis not present

## 2020-10-19 DIAGNOSIS — M1991 Primary osteoarthritis, unspecified site: Secondary | ICD-10-CM | POA: Diagnosis not present

## 2020-10-19 DIAGNOSIS — I700 Atherosclerosis of aorta: Secondary | ICD-10-CM | POA: Diagnosis not present

## 2020-10-19 DIAGNOSIS — Z1389 Encounter for screening for other disorder: Secondary | ICD-10-CM | POA: Diagnosis not present

## 2020-10-19 DIAGNOSIS — G894 Chronic pain syndrome: Secondary | ICD-10-CM | POA: Diagnosis not present

## 2020-12-06 DIAGNOSIS — I10 Essential (primary) hypertension: Secondary | ICD-10-CM | POA: Diagnosis not present

## 2020-12-06 DIAGNOSIS — E785 Hyperlipidemia, unspecified: Secondary | ICD-10-CM | POA: Diagnosis not present

## 2020-12-06 DIAGNOSIS — K219 Gastro-esophageal reflux disease without esophagitis: Secondary | ICD-10-CM | POA: Diagnosis not present

## 2020-12-06 DIAGNOSIS — M159 Polyosteoarthritis, unspecified: Secondary | ICD-10-CM | POA: Diagnosis not present

## 2021-02-13 DIAGNOSIS — E663 Overweight: Secondary | ICD-10-CM | POA: Diagnosis not present

## 2021-02-13 DIAGNOSIS — M1991 Primary osteoarthritis, unspecified site: Secondary | ICD-10-CM | POA: Diagnosis not present

## 2021-02-13 DIAGNOSIS — Z6826 Body mass index (BMI) 26.0-26.9, adult: Secondary | ICD-10-CM | POA: Diagnosis not present

## 2021-02-13 DIAGNOSIS — G894 Chronic pain syndrome: Secondary | ICD-10-CM | POA: Diagnosis not present

## 2021-02-13 DIAGNOSIS — I10 Essential (primary) hypertension: Secondary | ICD-10-CM | POA: Diagnosis not present

## 2021-05-16 DIAGNOSIS — E663 Overweight: Secondary | ICD-10-CM | POA: Diagnosis not present

## 2021-05-16 DIAGNOSIS — I10 Essential (primary) hypertension: Secondary | ICD-10-CM | POA: Diagnosis not present

## 2021-05-16 DIAGNOSIS — G894 Chronic pain syndrome: Secondary | ICD-10-CM | POA: Diagnosis not present

## 2021-05-16 DIAGNOSIS — Z6827 Body mass index (BMI) 27.0-27.9, adult: Secondary | ICD-10-CM | POA: Diagnosis not present

## 2021-05-16 DIAGNOSIS — K219 Gastro-esophageal reflux disease without esophagitis: Secondary | ICD-10-CM | POA: Diagnosis not present

## 2021-05-16 DIAGNOSIS — M1991 Primary osteoarthritis, unspecified site: Secondary | ICD-10-CM | POA: Diagnosis not present

## 2021-08-15 DIAGNOSIS — E663 Overweight: Secondary | ICD-10-CM | POA: Diagnosis not present

## 2021-08-15 DIAGNOSIS — Z0000 Encounter for general adult medical examination without abnormal findings: Secondary | ICD-10-CM | POA: Diagnosis not present

## 2021-08-15 DIAGNOSIS — Z6826 Body mass index (BMI) 26.0-26.9, adult: Secondary | ICD-10-CM | POA: Diagnosis not present

## 2021-08-15 DIAGNOSIS — G894 Chronic pain syndrome: Secondary | ICD-10-CM | POA: Diagnosis not present

## 2021-08-15 DIAGNOSIS — Z1331 Encounter for screening for depression: Secondary | ICD-10-CM | POA: Diagnosis not present

## 2021-11-12 DIAGNOSIS — Z6826 Body mass index (BMI) 26.0-26.9, adult: Secondary | ICD-10-CM | POA: Diagnosis not present

## 2021-11-12 DIAGNOSIS — I700 Atherosclerosis of aorta: Secondary | ICD-10-CM | POA: Diagnosis not present

## 2021-11-12 DIAGNOSIS — G894 Chronic pain syndrome: Secondary | ICD-10-CM | POA: Diagnosis not present

## 2022-02-05 DIAGNOSIS — E663 Overweight: Secondary | ICD-10-CM | POA: Diagnosis not present

## 2022-02-05 DIAGNOSIS — M1991 Primary osteoarthritis, unspecified site: Secondary | ICD-10-CM | POA: Diagnosis not present

## 2022-02-05 DIAGNOSIS — Z6826 Body mass index (BMI) 26.0-26.9, adult: Secondary | ICD-10-CM | POA: Diagnosis not present

## 2022-02-05 DIAGNOSIS — I10 Essential (primary) hypertension: Secondary | ICD-10-CM | POA: Diagnosis not present

## 2022-02-05 DIAGNOSIS — G894 Chronic pain syndrome: Secondary | ICD-10-CM | POA: Diagnosis not present

## 2022-02-27 ENCOUNTER — Encounter (INDEPENDENT_AMBULATORY_CARE_PROVIDER_SITE_OTHER): Payer: Self-pay | Admitting: *Deleted

## 2022-04-08 DIAGNOSIS — Z6826 Body mass index (BMI) 26.0-26.9, adult: Secondary | ICD-10-CM | POA: Diagnosis not present

## 2022-04-08 DIAGNOSIS — G894 Chronic pain syndrome: Secondary | ICD-10-CM | POA: Diagnosis not present

## 2022-04-08 DIAGNOSIS — I10 Essential (primary) hypertension: Secondary | ICD-10-CM | POA: Diagnosis not present

## 2022-04-08 DIAGNOSIS — I700 Atherosclerosis of aorta: Secondary | ICD-10-CM | POA: Diagnosis not present

## 2022-04-08 DIAGNOSIS — M1991 Primary osteoarthritis, unspecified site: Secondary | ICD-10-CM | POA: Diagnosis not present

## 2022-07-05 DIAGNOSIS — G894 Chronic pain syndrome: Secondary | ICD-10-CM | POA: Diagnosis not present

## 2022-07-05 DIAGNOSIS — M1991 Primary osteoarthritis, unspecified site: Secondary | ICD-10-CM | POA: Diagnosis not present

## 2022-07-05 DIAGNOSIS — Z6826 Body mass index (BMI) 26.0-26.9, adult: Secondary | ICD-10-CM | POA: Diagnosis not present

## 2022-07-05 DIAGNOSIS — I10 Essential (primary) hypertension: Secondary | ICD-10-CM | POA: Diagnosis not present

## 2022-10-04 DIAGNOSIS — G894 Chronic pain syndrome: Secondary | ICD-10-CM | POA: Diagnosis not present

## 2022-10-04 DIAGNOSIS — I700 Atherosclerosis of aorta: Secondary | ICD-10-CM | POA: Diagnosis not present

## 2022-10-04 DIAGNOSIS — Z0001 Encounter for general adult medical examination with abnormal findings: Secondary | ICD-10-CM | POA: Diagnosis not present

## 2022-10-04 DIAGNOSIS — M1991 Primary osteoarthritis, unspecified site: Secondary | ICD-10-CM | POA: Diagnosis not present

## 2022-10-04 DIAGNOSIS — E663 Overweight: Secondary | ICD-10-CM | POA: Diagnosis not present

## 2022-10-04 DIAGNOSIS — E039 Hypothyroidism, unspecified: Secondary | ICD-10-CM | POA: Diagnosis not present

## 2022-10-04 DIAGNOSIS — D518 Other vitamin B12 deficiency anemias: Secondary | ICD-10-CM | POA: Diagnosis not present

## 2022-10-04 DIAGNOSIS — Z1331 Encounter for screening for depression: Secondary | ICD-10-CM | POA: Diagnosis not present

## 2022-10-04 DIAGNOSIS — E559 Vitamin D deficiency, unspecified: Secondary | ICD-10-CM | POA: Diagnosis not present

## 2022-10-04 DIAGNOSIS — I10 Essential (primary) hypertension: Secondary | ICD-10-CM | POA: Diagnosis not present

## 2022-10-04 DIAGNOSIS — Z125 Encounter for screening for malignant neoplasm of prostate: Secondary | ICD-10-CM | POA: Diagnosis not present

## 2022-10-04 DIAGNOSIS — Z6826 Body mass index (BMI) 26.0-26.9, adult: Secondary | ICD-10-CM | POA: Diagnosis not present

## 2022-10-15 DIAGNOSIS — R7309 Other abnormal glucose: Secondary | ICD-10-CM | POA: Diagnosis not present

## 2022-10-15 DIAGNOSIS — D518 Other vitamin B12 deficiency anemias: Secondary | ICD-10-CM | POA: Diagnosis not present

## 2022-11-27 DIAGNOSIS — D518 Other vitamin B12 deficiency anemias: Secondary | ICD-10-CM | POA: Diagnosis not present

## 2022-12-30 DIAGNOSIS — I700 Atherosclerosis of aorta: Secondary | ICD-10-CM | POA: Diagnosis not present

## 2022-12-30 DIAGNOSIS — I10 Essential (primary) hypertension: Secondary | ICD-10-CM | POA: Diagnosis not present

## 2022-12-30 DIAGNOSIS — G894 Chronic pain syndrome: Secondary | ICD-10-CM | POA: Diagnosis not present

## 2022-12-30 DIAGNOSIS — D518 Other vitamin B12 deficiency anemias: Secondary | ICD-10-CM | POA: Diagnosis not present

## 2022-12-30 DIAGNOSIS — R7309 Other abnormal glucose: Secondary | ICD-10-CM | POA: Diagnosis not present

## 2022-12-30 DIAGNOSIS — E663 Overweight: Secondary | ICD-10-CM | POA: Diagnosis not present

## 2022-12-30 DIAGNOSIS — M1991 Primary osteoarthritis, unspecified site: Secondary | ICD-10-CM | POA: Diagnosis not present

## 2023-01-28 DIAGNOSIS — D518 Other vitamin B12 deficiency anemias: Secondary | ICD-10-CM | POA: Diagnosis not present

## 2023-02-27 DIAGNOSIS — D518 Other vitamin B12 deficiency anemias: Secondary | ICD-10-CM | POA: Diagnosis not present

## 2023-03-21 DIAGNOSIS — E039 Hypothyroidism, unspecified: Secondary | ICD-10-CM | POA: Diagnosis not present

## 2023-03-21 DIAGNOSIS — G894 Chronic pain syndrome: Secondary | ICD-10-CM | POA: Diagnosis not present

## 2023-03-21 DIAGNOSIS — R7309 Other abnormal glucose: Secondary | ICD-10-CM | POA: Diagnosis not present

## 2023-03-21 DIAGNOSIS — I700 Atherosclerosis of aorta: Secondary | ICD-10-CM | POA: Diagnosis not present

## 2023-03-21 DIAGNOSIS — I10 Essential (primary) hypertension: Secondary | ICD-10-CM | POA: Diagnosis not present

## 2023-03-21 DIAGNOSIS — D518 Other vitamin B12 deficiency anemias: Secondary | ICD-10-CM | POA: Diagnosis not present

## 2023-03-21 DIAGNOSIS — Z6824 Body mass index (BMI) 24.0-24.9, adult: Secondary | ICD-10-CM | POA: Diagnosis not present

## 2023-03-21 DIAGNOSIS — Z125 Encounter for screening for malignant neoplasm of prostate: Secondary | ICD-10-CM | POA: Diagnosis not present

## 2023-03-21 DIAGNOSIS — M1991 Primary osteoarthritis, unspecified site: Secondary | ICD-10-CM | POA: Diagnosis not present

## 2023-03-21 DIAGNOSIS — E559 Vitamin D deficiency, unspecified: Secondary | ICD-10-CM | POA: Diagnosis not present

## 2023-03-31 DIAGNOSIS — D518 Other vitamin B12 deficiency anemias: Secondary | ICD-10-CM | POA: Diagnosis not present

## 2023-04-29 DIAGNOSIS — D518 Other vitamin B12 deficiency anemias: Secondary | ICD-10-CM | POA: Diagnosis not present

## 2023-08-29 DIAGNOSIS — D518 Other vitamin B12 deficiency anemias: Secondary | ICD-10-CM | POA: Diagnosis not present

## 2023-09-05 DIAGNOSIS — M1991 Primary osteoarthritis, unspecified site: Secondary | ICD-10-CM | POA: Diagnosis not present

## 2023-09-05 DIAGNOSIS — I700 Atherosclerosis of aorta: Secondary | ICD-10-CM | POA: Diagnosis not present

## 2023-09-05 DIAGNOSIS — I10 Essential (primary) hypertension: Secondary | ICD-10-CM | POA: Diagnosis not present

## 2023-09-05 DIAGNOSIS — G894 Chronic pain syndrome: Secondary | ICD-10-CM | POA: Diagnosis not present

## 2023-09-24 DIAGNOSIS — M1991 Primary osteoarthritis, unspecified site: Secondary | ICD-10-CM | POA: Diagnosis not present

## 2023-09-24 DIAGNOSIS — Z6825 Body mass index (BMI) 25.0-25.9, adult: Secondary | ICD-10-CM | POA: Diagnosis not present

## 2023-09-24 DIAGNOSIS — I700 Atherosclerosis of aorta: Secondary | ICD-10-CM | POA: Diagnosis not present

## 2023-09-24 DIAGNOSIS — K219 Gastro-esophageal reflux disease without esophagitis: Secondary | ICD-10-CM | POA: Diagnosis not present

## 2023-09-24 DIAGNOSIS — G894 Chronic pain syndrome: Secondary | ICD-10-CM | POA: Diagnosis not present

## 2023-09-24 DIAGNOSIS — E663 Overweight: Secondary | ICD-10-CM | POA: Diagnosis not present

## 2023-09-24 DIAGNOSIS — I10 Essential (primary) hypertension: Secondary | ICD-10-CM | POA: Diagnosis not present

## 2023-09-29 DIAGNOSIS — D518 Other vitamin B12 deficiency anemias: Secondary | ICD-10-CM | POA: Diagnosis not present

## 2023-11-21 DIAGNOSIS — M1991 Primary osteoarthritis, unspecified site: Secondary | ICD-10-CM | POA: Diagnosis not present

## 2023-11-21 DIAGNOSIS — Z9229 Personal history of other drug therapy: Secondary | ICD-10-CM | POA: Diagnosis not present

## 2023-11-21 DIAGNOSIS — Z6825 Body mass index (BMI) 25.0-25.9, adult: Secondary | ICD-10-CM | POA: Diagnosis not present

## 2023-11-21 DIAGNOSIS — E663 Overweight: Secondary | ICD-10-CM | POA: Diagnosis not present

## 2023-11-21 DIAGNOSIS — R7309 Other abnormal glucose: Secondary | ICD-10-CM | POA: Diagnosis not present

## 2023-11-21 DIAGNOSIS — Z125 Encounter for screening for malignant neoplasm of prostate: Secondary | ICD-10-CM | POA: Diagnosis not present

## 2023-11-21 DIAGNOSIS — J0100 Acute maxillary sinusitis, unspecified: Secondary | ICD-10-CM | POA: Diagnosis not present

## 2023-11-21 DIAGNOSIS — Z0001 Encounter for general adult medical examination with abnormal findings: Secondary | ICD-10-CM | POA: Diagnosis not present

## 2023-11-21 DIAGNOSIS — G894 Chronic pain syndrome: Secondary | ICD-10-CM | POA: Diagnosis not present

## 2023-11-21 DIAGNOSIS — Z1331 Encounter for screening for depression: Secondary | ICD-10-CM | POA: Diagnosis not present

## 2023-11-21 DIAGNOSIS — I10 Essential (primary) hypertension: Secondary | ICD-10-CM | POA: Diagnosis not present

## 2023-12-18 DIAGNOSIS — Z6825 Body mass index (BMI) 25.0-25.9, adult: Secondary | ICD-10-CM | POA: Diagnosis not present

## 2023-12-18 DIAGNOSIS — G894 Chronic pain syndrome: Secondary | ICD-10-CM | POA: Diagnosis not present

## 2023-12-18 DIAGNOSIS — M47816 Spondylosis without myelopathy or radiculopathy, lumbar region: Secondary | ICD-10-CM | POA: Diagnosis not present

## 2023-12-18 DIAGNOSIS — Z9229 Personal history of other drug therapy: Secondary | ICD-10-CM | POA: Diagnosis not present

## 2023-12-18 DIAGNOSIS — I10 Essential (primary) hypertension: Secondary | ICD-10-CM | POA: Diagnosis not present

## 2023-12-18 DIAGNOSIS — M1991 Primary osteoarthritis, unspecified site: Secondary | ICD-10-CM | POA: Diagnosis not present

## 2024-01-16 DIAGNOSIS — G894 Chronic pain syndrome: Secondary | ICD-10-CM | POA: Diagnosis not present

## 2024-01-16 DIAGNOSIS — M47816 Spondylosis without myelopathy or radiculopathy, lumbar region: Secondary | ICD-10-CM | POA: Diagnosis not present

## 2024-01-16 DIAGNOSIS — M1991 Primary osteoarthritis, unspecified site: Secondary | ICD-10-CM | POA: Diagnosis not present

## 2024-01-16 DIAGNOSIS — I10 Essential (primary) hypertension: Secondary | ICD-10-CM | POA: Diagnosis not present

## 2024-01-23 ENCOUNTER — Emergency Department (HOSPITAL_COMMUNITY)

## 2024-01-23 ENCOUNTER — Other Ambulatory Visit: Payer: Self-pay

## 2024-01-23 ENCOUNTER — Inpatient Hospital Stay (HOSPITAL_COMMUNITY)
Admission: EM | Admit: 2024-01-23 | Discharge: 2024-01-25 | DRG: 394 | Disposition: A | Discharge status: Home / Self Care | Attending: Internal Medicine | Admitting: Internal Medicine

## 2024-01-23 ENCOUNTER — Encounter (HOSPITAL_COMMUNITY): Payer: Self-pay | Admitting: *Deleted

## 2024-01-23 DIAGNOSIS — T183XXA Foreign body in small intestine, initial encounter: Secondary | ICD-10-CM | POA: Diagnosis not present

## 2024-01-23 DIAGNOSIS — I499 Cardiac arrhythmia, unspecified: Secondary | ICD-10-CM | POA: Diagnosis not present

## 2024-01-23 DIAGNOSIS — K5669 Other partial intestinal obstruction: Secondary | ICD-10-CM | POA: Diagnosis present

## 2024-01-23 DIAGNOSIS — R197 Diarrhea, unspecified: Secondary | ICD-10-CM | POA: Diagnosis not present

## 2024-01-23 DIAGNOSIS — I10 Essential (primary) hypertension: Secondary | ICD-10-CM | POA: Diagnosis not present

## 2024-01-23 DIAGNOSIS — K219 Gastro-esophageal reflux disease without esophagitis: Secondary | ICD-10-CM | POA: Diagnosis present

## 2024-01-23 DIAGNOSIS — Z96641 Presence of right artificial hip joint: Secondary | ICD-10-CM | POA: Diagnosis present

## 2024-01-23 DIAGNOSIS — E7800 Pure hypercholesterolemia, unspecified: Secondary | ICD-10-CM | POA: Diagnosis present

## 2024-01-23 DIAGNOSIS — M1611 Unilateral primary osteoarthritis, right hip: Secondary | ICD-10-CM | POA: Diagnosis present

## 2024-01-23 DIAGNOSIS — D72829 Elevated white blood cell count, unspecified: Secondary | ICD-10-CM | POA: Diagnosis present

## 2024-01-23 DIAGNOSIS — K56609 Unspecified intestinal obstruction, unspecified as to partial versus complete obstruction: Secondary | ICD-10-CM | POA: Diagnosis not present

## 2024-01-23 DIAGNOSIS — R6889 Other general symptoms and signs: Secondary | ICD-10-CM | POA: Diagnosis not present

## 2024-01-23 DIAGNOSIS — Z79899 Other long term (current) drug therapy: Secondary | ICD-10-CM

## 2024-01-23 DIAGNOSIS — Z79891 Long term (current) use of opiate analgesic: Secondary | ICD-10-CM | POA: Diagnosis not present

## 2024-01-23 DIAGNOSIS — E785 Hyperlipidemia, unspecified: Secondary | ICD-10-CM | POA: Diagnosis present

## 2024-01-23 DIAGNOSIS — Z87891 Personal history of nicotine dependence: Secondary | ICD-10-CM | POA: Diagnosis not present

## 2024-01-23 DIAGNOSIS — F4310 Post-traumatic stress disorder, unspecified: Secondary | ICD-10-CM | POA: Diagnosis present

## 2024-01-23 DIAGNOSIS — R0689 Other abnormalities of breathing: Secondary | ICD-10-CM | POA: Diagnosis not present

## 2024-01-23 DIAGNOSIS — R109 Unspecified abdominal pain: Secondary | ICD-10-CM | POA: Diagnosis not present

## 2024-01-23 HISTORY — DX: Post-traumatic stress disorder, unspecified: F43.10

## 2024-01-23 LAB — CBC
HCT: 42.9 % (ref 39.0–52.0)
Hemoglobin: 14.9 g/dL (ref 13.0–17.0)
MCH: 33.2 pg (ref 26.0–34.0)
MCHC: 34.7 g/dL (ref 30.0–36.0)
MCV: 95.5 fL (ref 80.0–100.0)
Platelets: 233 10*3/uL (ref 150–400)
RBC: 4.49 MIL/uL (ref 4.22–5.81)
RDW: 12.7 % (ref 11.5–15.5)
WBC: 11.7 10*3/uL — ABNORMAL HIGH (ref 4.0–10.5)
nRBC: 0 % (ref 0.0–0.2)

## 2024-01-23 LAB — COMPREHENSIVE METABOLIC PANEL WITH GFR
ALT: 13 U/L (ref 0–44)
AST: 19 U/L (ref 15–41)
Albumin: 3.6 g/dL (ref 3.5–5.0)
Alkaline Phosphatase: 81 U/L (ref 38–126)
Anion gap: 13 (ref 5–15)
BUN: 16 mg/dL (ref 8–23)
CO2: 17 mmol/L — ABNORMAL LOW (ref 22–32)
Calcium: 9.8 mg/dL (ref 8.9–10.3)
Chloride: 104 mmol/L (ref 98–111)
Creatinine, Ser: 0.91 mg/dL (ref 0.61–1.24)
GFR, Estimated: >60 mL/min (ref >60)
Glucose, Bld: 121 mg/dL — ABNORMAL HIGH (ref 70–99)
Potassium: 3.8 mmol/L (ref 3.5–5.1)
Sodium: 134 mmol/L — ABNORMAL LOW (ref 135–145)
Total Bilirubin: 1 mg/dL (ref 0.0–1.2)
Total Protein: 6.5 g/dL (ref 6.5–8.1)

## 2024-01-23 LAB — LIPASE, BLOOD: Lipase: 33 U/L (ref 11–51)

## 2024-01-23 MED ORDER — ONDANSETRON HCL 4 MG/2ML IJ SOLN
4.0000 mg | Freq: Once | INTRAMUSCULAR | Status: AC
  Administered 2024-01-23: 4 mg via INTRAVENOUS
  Filled 2024-01-23: qty 2

## 2024-01-23 MED ORDER — SODIUM CHLORIDE 0.9 % IV SOLN: INTRAVENOUS | Status: AC

## 2024-01-23 MED ORDER — ACETAMINOPHEN 325 MG PO TABS: 650.0000 mg | ORAL_TABLET | Freq: Four times a day (QID) | ORAL | Status: DC | PRN

## 2024-01-23 MED ORDER — HYDRALAZINE HCL 20 MG/ML IJ SOLN
10.0000 mg | INTRAMUSCULAR | Status: DC | PRN
  Administered 2024-01-23: 10 mg via INTRAVENOUS
  Filled 2024-01-23: qty 1

## 2024-01-23 MED ORDER — PANTOPRAZOLE SODIUM 40 MG IV SOLR
40.0000 mg | INTRAVENOUS | Status: DC
  Administered 2024-01-24 – 2024-01-25 (×2): 40 mg via INTRAVENOUS
  Filled 2024-01-23 (×2): qty 10

## 2024-01-23 MED ORDER — ALUM & MAG HYDROXIDE-SIMETH 200-200-20 MG/5ML PO SUSP
30.0000 mL | Freq: Once | ORAL | Status: AC
  Administered 2024-01-23: 30 mL via ORAL
  Filled 2024-01-23: qty 30

## 2024-01-23 MED ORDER — FOLIC ACID 1 MG PO TABS
1.0000 mg | ORAL_TABLET | Freq: Every day | ORAL | Status: DC
  Administered 2024-01-24 – 2024-01-25 (×2): 1 mg via ORAL
  Filled 2024-01-23 (×2): qty 1

## 2024-01-23 MED ORDER — PANTOPRAZOLE SODIUM 40 MG IV SOLR
40.0000 mg | Freq: Once | INTRAVENOUS | Status: AC
  Administered 2024-01-23: 40 mg via INTRAVENOUS
  Filled 2024-01-23: qty 10

## 2024-01-23 MED ORDER — LIDOCAINE VISCOUS HCL 2 % MT SOLN
15.0000 mL | Freq: Once | OROMUCOSAL | Status: AC
  Administered 2024-01-23: 15 mL via ORAL
  Filled 2024-01-23: qty 15

## 2024-01-23 MED ORDER — MORPHINE SULFATE (PF) 2 MG/ML IV SOLN: 2.0000 mg | INTRAVENOUS | Status: DC | PRN

## 2024-01-23 MED ORDER — ADULT MULTIVITAMIN W/MINERALS CH
1.0000 | ORAL_TABLET | Freq: Every day | ORAL | Status: DC
  Administered 2024-01-24 – 2024-01-25 (×2): 1 via ORAL
  Filled 2024-01-23 (×2): qty 1

## 2024-01-23 MED ORDER — ENOXAPARIN SODIUM 40 MG/0.4ML IJ SOSY
40.0000 mg | PREFILLED_SYRINGE | INTRAMUSCULAR | Status: DC
  Administered 2024-01-23 – 2024-01-24 (×2): 40 mg via SUBCUTANEOUS
  Filled 2024-01-23 (×2): qty 0.4

## 2024-01-23 MED ORDER — MORPHINE SULFATE (PF) 4 MG/ML IV SOLN
4.0000 mg | Freq: Once | INTRAVENOUS | Status: AC
  Administered 2024-01-23: 4 mg via INTRAVENOUS
  Filled 2024-01-23: qty 1

## 2024-01-23 MED ORDER — THIAMINE MONONITRATE 100 MG PO TABS
100.0000 mg | ORAL_TABLET | Freq: Every day | ORAL | Status: DC
  Administered 2024-01-24 – 2024-01-25 (×2): 100 mg via ORAL
  Filled 2024-01-23 (×2): qty 1

## 2024-01-23 MED ORDER — LACTATED RINGERS IV BOLUS
1000.0000 mL | Freq: Once | INTRAVENOUS | Status: AC
  Administered 2024-01-23: 1000 mL via INTRAVENOUS

## 2024-01-23 MED ORDER — LORAZEPAM 1 MG PO TABS: 1.0000 mg | ORAL_TABLET | ORAL | Status: DC | PRN

## 2024-01-23 MED ORDER — ONDANSETRON HCL 4 MG/2ML IJ SOLN: 4.0000 mg | Freq: Four times a day (QID) | INTRAMUSCULAR | Status: DC | PRN

## 2024-01-23 MED ORDER — THIAMINE HCL 100 MG/ML IJ SOLN
100.0000 mg | Freq: Every day | INTRAMUSCULAR | Status: DC
  Administered 2024-01-23: 100 mg via INTRAVENOUS
  Filled 2024-01-23: qty 2

## 2024-01-23 MED ORDER — ONDANSETRON HCL 4 MG PO TABS: 4.0000 mg | ORAL_TABLET | Freq: Four times a day (QID) | ORAL | Status: DC | PRN

## 2024-01-23 MED ORDER — LORAZEPAM 2 MG/ML IJ SOLN
1.0000 mg | INTRAMUSCULAR | Status: DC | PRN
  Administered 2024-01-23: 1 mg via INTRAVENOUS
  Filled 2024-01-23: qty 1

## 2024-01-23 MED ORDER — IOHEXOL 300 MG/ML  SOLN
100.0000 mL | Freq: Once | INTRAMUSCULAR | Status: AC | PRN
  Administered 2024-01-23: 100 mL via INTRAVENOUS

## 2024-01-23 MED ORDER — ACETAMINOPHEN 650 MG RE SUPP: 650.0000 mg | Freq: Four times a day (QID) | RECTAL | Status: DC | PRN

## 2024-01-23 MED ORDER — BISACODYL 10 MG RE SUPP
10.0000 mg | Freq: Every day | RECTAL | Status: DC
  Administered 2024-01-23 – 2024-01-24 (×2): 10 mg via RECTAL
  Filled 2024-01-23 (×2): qty 1

## 2024-01-23 NOTE — ED Notes (Signed)
 Dr. Mason Sole made aware face to face, of patient refusal of NG tube at this time.
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
## 2024-01-23 NOTE — ED Notes (Signed)
 Urinal given to patient and informed of need for urine sample.
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
## 2024-01-23 NOTE — TOC Initial Note (Signed)
 Transition of Care [facility]) - Initial/Assessment Note    Patient Details  Name: Garrett Higgins MRN: [id number] Date of Birth: December 11, 1945  [facility]) CM/SW Contact:    Cyndie Dredge, LCSWA Phone Number: 01/23/2024, 3:08 PM  Clinical Narrative:                  A consult was placed for Substance abuse counseling/ education . Patient declined for resources to be added AVS. Patient is fairly independent, but has support from spouse and granddaughter. Patient wears a brace at home but does not use any other equipment and still able to drive. This Clinical research associate is clearing consult. TOC will continue to follow chart and get back involved if a new consult is place. TOC to follow.  Expected Discharge Plan: Home/Self Care Barriers to Discharge: Continued Medical Work up   Patient Goals and CMS Choice Patient states their goals for this hospitalization and ongoing recovery are:: DC back home CMS Medicare.gov Compare Post Acute Care list provided to:: Patient Choice offered to / list presented to : Patient      Expected Discharge Plan and Services In-house Referral: Clinical Social Work Discharge Planning Services: CM Consult   Living arrangements for the past 2 months: Single Family Home                                      Prior Living Arrangements/Services Living arrangements for the past 2 months: Single Family Home Lives with:: Spouse Patient language and need for interpreter reviewed:: Yes Do you feel safe going back to the place where you live?: Yes      Need for Family Participation in Patient Care: Yes (Comment) Care giver support system in place?: No (comment)   Criminal Activity/Legal Involvement Pertinent to Current Situation/Hospitalization: No - Comment as needed  Activities of Daily Living   ADL Screening (condition at time of admission) Independently performs ADLs?: Yes (appropriate for developmental age) Is the patient deaf or have difficulty  hearing?: No Does the patient have difficulty seeing, even when wearing glasses/contacts?: No Does the patient have difficulty concentrating, remembering, or making decisions?: No  Permission Sought/Granted      Share Information with NAME: Carles Cheadle and Goble Last     Permission granted to share info w Relationship: Spouse and patient     Emotional Assessment Appearance:: Appears stated age Attitude/Demeanor/Rapport: Engaged Affect (typically observed): Appropriate Orientation: : Oriented to Self, Oriented to Place, Oriented to  Time, Oriented to Situation Alcohol  / Substance Use: Not Applicable Psych Involvement: No (comment)  Admission diagnosis:  SBO (small bowel obstruction) (HCC) [K56.609] Patient Active Problem List   Diagnosis Date Noted   Traumatic brain injury (HCC) 06/29/2018   SBO (small bowel obstruction) (HCC) partial 04/14/2018   Intestinal adhesions with complete obstruction (HCC)    Osteoarthritis of right hip 07/21/2015   Anemia 02/29/2012   HTN (hypertension) 02/29/2012   GERD (gastroesophageal reflux disease) 02/29/2012   Hyperlipidemia 02/29/2012   PCP:  Kathyleen Parkins, MD Pharmacy:   [facility] - [place], [place] - [address] [place] [address] Phone: [phone] Fax: [phone]  [facility] - [place], [place] - [address]  [place] [address] Phone: [phone] Fax: [phone]     Social Drivers of Health (SDOH) Social History: SDOH Screenings   Food Insecurity: No Food Insecurity (01/23/2024)  Housing: Low Risk  (01/23/2024)  Transportation Needs: No Transportation Needs (01/23/2024)  Utilities: Not At Risk (01/23/2024)  Social Connections: Moderately Isolated (01/23/2024)  Tobacco Use: Medium Risk (01/23/2024)   SDOH Interventions:     Readmission Risk Interventions     No data to display
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
## 2024-01-23 NOTE — ED Notes (Signed)
 Patient transported to CT

## 2024-01-23 NOTE — Progress Notes (Signed)
 [facility]  Called by ED provider regarding this patient.  He presented with a 1 day history of nausea, vomiting, and abdominal pain.  He has a history of bowel obstructions in the past.  He had a laparotomy many years ago with possible mesh placement.  He was noted to be tachycardic in the ED with his other vitals being stable.  He was noted to have a mild leukocytosis of 11.7.  He underwent a CT of the abdomen and pelvis which demonstrated concern for a bowel obstruction secondary to a 2 cm piece of ingested material vs gallstone.  Upon my evaluation of the images, there is an ovoid structure within the bowel lumen (whose shape is not characteristic of a gallstone) resulting in an obstruction.  This object is not at the terminal ileum.  There does not appear to be a fistula between the gallbladder to the small bowel, and no pneumobilia is appreciated on imaging.  His LFTs are also within normal limits.  I favor this structure to represent something that was ingested over a gallstone currently.  With the patient being hemodynamically stable, will manage him conservatively with NPO, NG tube placement, and IVF.  Will see if he is able to pass this structure without surgery.  Please call with any questions or concerns.  Lidia Reels, DO [facility] [address] [place], [place] [address] [phone] (office)

## 2024-01-23 NOTE — Progress Notes (Signed)
 Patient had 1 BM s/p suppository. UTA as patient had flushed prior to. HR 120-130s sustained. Denies any straining.

## 2024-01-23 NOTE — ED Notes (Signed)
 Patient refuseing the NG tube at this time, Patient stated "he would like to speak to the surgeon first and has not had much abdominal pain or vomiting episodes.'
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
## 2024-01-23 NOTE — ED Notes (Signed)
 Provider made aware of patient Blood Pressure.

## 2024-01-24 ENCOUNTER — Inpatient Hospital Stay (HOSPITAL_COMMUNITY)

## 2024-01-24 DIAGNOSIS — K56609 Unspecified intestinal obstruction, unspecified as to partial versus complete obstruction: Secondary | ICD-10-CM | POA: Diagnosis not present

## 2024-01-24 LAB — MAGNESIUM: Magnesium: 1.9 mg/dL (ref 1.7–2.4)

## 2024-01-24 LAB — COMPREHENSIVE METABOLIC PANEL WITH GFR
ALT: 8 U/L (ref 0–44)
AST: 14 U/L — ABNORMAL LOW (ref 15–41)
Albumin: 2.8 g/dL — ABNORMAL LOW (ref 3.5–5.0)
Alkaline Phosphatase: 61 U/L (ref 38–126)
Anion gap: 6 (ref 5–15)
BUN: 13 mg/dL (ref 8–23)
CO2: 21 mmol/L — ABNORMAL LOW (ref 22–32)
Calcium: 8.9 mg/dL (ref 8.9–10.3)
Chloride: 107 mmol/L (ref 98–111)
Creatinine, Ser: 0.84 mg/dL (ref 0.61–1.24)
GFR, Estimated: >60 mL/min (ref >60)
Glucose, Bld: 100 mg/dL — ABNORMAL HIGH (ref 70–99)
Potassium: 3.8 mmol/L (ref 3.5–5.1)
Sodium: 134 mmol/L — ABNORMAL LOW (ref 135–145)
Total Bilirubin: 0.7 mg/dL (ref 0.0–1.2)
Total Protein: 5.1 g/dL — ABNORMAL LOW (ref 6.5–8.1)

## 2024-01-24 LAB — CBC
HCT: 34.6 % — ABNORMAL LOW (ref 39.0–52.0)
Hemoglobin: 11.6 g/dL — ABNORMAL LOW (ref 13.0–17.0)
MCH: 33.3 pg (ref 26.0–34.0)
MCHC: 33.5 g/dL (ref 30.0–36.0)
MCV: 99.4 fL (ref 80.0–100.0)
Platelets: 168 10*3/uL (ref 150–400)
RBC: 3.48 MIL/uL — ABNORMAL LOW (ref 4.22–5.81)
RDW: 13.2 % (ref 11.5–15.5)
WBC: 5.5 10*3/uL (ref 4.0–10.5)
nRBC: 0 % (ref 0.0–0.2)

## 2024-01-24 LAB — LACTIC ACID, PLASMA: Lactic Acid, Venous: 0.8 mmol/L (ref 0.5–1.9)

## 2024-01-24 MED ORDER — DIATRIZOATE MEGLUMINE & SODIUM 66-10 % PO SOLN
90.0000 mL | Freq: Once | ORAL | Status: AC
  Administered 2024-01-24: 90 mL via ORAL
  Filled 2024-01-24: qty 90

## 2024-01-24 MED ORDER — MELATONIN 3 MG PO TABS
6.0000 mg | ORAL_TABLET | Freq: Once | ORAL | Status: AC
  Administered 2024-01-24: 6 mg via ORAL
  Filled 2024-01-24: qty 2

## 2024-01-24 MED ORDER — LISINOPRIL 10 MG PO TABS
10.0000 mg | ORAL_TABLET | Freq: Every day | ORAL | Status: DC
  Administered 2024-01-24 – 2024-01-25 (×2): 10 mg via ORAL
  Filled 2024-01-24 (×2): qty 1

## 2024-01-24 MED ORDER — GUAIFENESIN-DM 100-10 MG/5ML PO SYRP
5.0000 mL | ORAL_SOLUTION | ORAL | Status: DC | PRN
  Administered 2024-01-24: 5 mL via ORAL
  Filled 2024-01-24: qty 5

## 2024-01-24 NOTE — Progress Notes (Addendum)
 0130 emergency staff alarm activated. Staff to room to find patient sitting on bed. Stated he could not find call bell and hit the emergency button on wall. States he had to go to bathroom urgently to have a bm and got up without calling and fell on his knees. No abrasions noted. Pt denies pain. Call bell was in bed with patient. Pt states he got up and felt a little disoriented. Vitals signs stable. MD made aware. No new orders at this time. IV still in place but IV tubing torn and leaking. Replaced tubing and re-dressed IV and flushed to check for patency. New bag of saline started. Bed alarm on, dim lights on and call bell within patient reach. Educated patient on reasoning behind bed alarm and instructed to call prior to getting out of bed. Pt verbalized understanding.

## 2024-01-24 NOTE — Progress Notes (Signed)
 PROGRESS NOTE    Garrett Higgins  ZOX:[id number] DOB: [birth year] DOA: 01/23/2024 PCP: Kathyleen Parkins, MD   Brief Narrative:    Garrett Higgins is a 78 y.o. male with medical history significant for dyslipidemia, hypertension, arthritis, PTSD, and GERD who presented to the ED with complaints of abdominal pain as well as nausea and vomiting that began shortly after supper last night.   Assessment & Plan:   Principal Problem:   SBO (small bowel obstruction) (HCC) partial Active Problems:   HTN (hypertension)   GERD (gastroesophageal reflux disease)   Hyperlipidemia   Osteoarthritis of right hip  Assessment and Plan:  Abdominal pain with nausea and vomiting secondary to gallstone ileus versus SBO - Keep n.p.o. - Antiemetics - IV fluid - IV pain management - Appreciate general surgery for further evaluation   Hypertension-uncontrolled - Likely secondary to poor p.o. intake and inability to take medications - IV hydralazine  for severe elevations - Resume home blood pressure medications once able to tolerate   Dyslipidemia - Hold statin for now   History of alcohol  abuse - Drinks 3 beers and hard liquor on a daily basis - CIWA protocol    DVT prophylaxis: Lovenox  Code Status: Full Family Communication: None at bedside Disposition Plan:  Status is: Inpatient Remains inpatient appropriate because: Need for IV medications.   Consultants:  General surgery  Procedures:  None  Antimicrobials:  None   Subjective: Patient seen and evaluated today with no new acute complaints or concerns. No acute concerns or events noted overnight.  Objective: Vitals:   01/23/24 2229 01/24/24 0124 01/24/24 0556 01/24/24 1316  BP: (!) 152/86 131/78 (!) 143/81 (!) 172/94  Pulse: 87 (!) 102 77 72  Resp: 20 20  20   Temp: 99 F (37.2 C) 98.2 F (36.8 C) 98.2 F (36.8 C) 98.9 F (37.2 C)  TempSrc: Oral Oral Oral Oral  SpO2: 98% 98% 98% 99%  Weight:      Height:         Intake/Output Summary (Last 24 hours) at 01/24/2024 1514 Last data filed at 01/24/2024 1127 Gross per 24 hour  Intake 1477.12 ml  Output --  Net 1477.12 ml   Filed Weights   01/23/24 1010  Weight: 83.9 kg    Examination:  General exam: Appears calm and comfortable  Respiratory system: Clear to auscultation. Respiratory effort normal. Cardiovascular system: S1 & S2 heard, RRR.  Gastrointestinal system: Abdomen is soft Central nervous system: Alert and awake Extremities: No edema Skin: No significant lesions noted Psychiatry: Flat affect.    Data Reviewed: I have personally reviewed following labs and imaging studies  CBC: Recent Labs  Lab 01/23/24 1113 01/24/24 0436  WBC 11.7* 5.5  HGB 14.9 11.6*  HCT 42.9 34.6*  MCV 95.5 99.4  PLT 233 168   Basic Metabolic Panel: Recent Labs  Lab 01/23/24 1113 01/24/24 0436  NA 134* 134*  K 3.8 3.8  CL 104 107  CO2 17* 21*  GLUCOSE 121* 100*  BUN 16 13  CREATININE 0.91 0.84  CALCIUM  9.8 8.9  MG  --  1.9   GFR: Estimated Creatinine Clearance: 75.9 mL/min (by C-G formula based on SCr of 0.84 mg/dL). Liver Function Tests: Recent Labs  Lab 01/23/24 1113 01/24/24 0436  AST 19 14*  ALT 13 8  ALKPHOS 81 61  BILITOT 1.0 0.7  PROT 6.5 5.1*  ALBUMIN 3.6 2.8*   Recent Labs  Lab 01/23/24 1113  LIPASE 33   No results for input(s): "  AMMONIA" in the last 168 hours. Coagulation Profile: No results for input(s): "INR", "PROTIME" in the last 168 hours. Cardiac Enzymes: No results for input(s): "CKTOTAL", "CKMB", "CKMBINDEX", "TROPONINI" in the last 168 hours. BNP (last 3 results) No results for input(s): "PROBNP" in the last 8760 hours. HbA1C: No results for input(s): "HGBA1C" in the last 72 hours. CBG: No results for input(s): "GLUCAP" in the last 168 hours. Lipid Profile: No results for input(s): "CHOL", "HDL", "LDLCALC", "TRIG", "CHOLHDL", "LDLDIRECT" in the last 72 hours. Thyroid  Function Tests: No results  for input(s): "TSH", "T4TOTAL", "FREET4", "T3FREE", "THYROIDAB" in the last 72 hours. Anemia Panel: No results for input(s): "VITAMINB12", "FOLATE", "FERRITIN", "TIBC", "IRON", "RETICCTPCT" in the last 72 hours. Sepsis Labs: Recent Labs  Lab 01/24/24 0436  LATICACIDVEN 0.8    No results found for this or any previous visit (from the past 240 hours).       Radiology Studies: DG Abd 2 Views Result Date: 01/24/2024 CLINICAL DATA:  Small-bowel obstruction EXAM: ABDOMEN - 2 VIEW COMPARISON:  01/23/2024 FINDINGS: Two supine frontal views of the abdomen and pelvis are obtained. Mildly distended gas-filled loops of small bowel are seen within the left upper quadrant measuring up to 3.4 cm, consistent with the small-bowel obstruction observed on previous CT. Relative paucity of colonic gas. No abdominal masses. Numerous metallic densities within the left chest, abdomen, and posterior abdominal wall compatible with shrapnel as seen on previous abdominal CT. Lung bases are clear. IMPRESSION: 1. Stable gaseous distension of the small bowel consistent with small-bowel obstruction. No significant change since prior study. Electronically Signed   By: Bobbye Burrow M.D.   On: 01/24/2024 12:06   CT ABDOMEN PELVIS W CONTRAST Result Date: 01/23/2024 CLINICAL DATA:  Abdominal pain. EXAM: CT ABDOMEN AND PELVIS WITH CONTRAST TECHNIQUE: Multidetector CT imaging of the abdomen and pelvis was performed using the standard protocol following bolus administration of intravenous contrast. RADIATION DOSE REDUCTION: This exam was performed according to the departmental dose-optimization program which includes automated exposure control, adjustment of the mA and/or kV according to patient size and/or use of iterative reconstruction technique. CONTRAST:  OMNIPAQUE  IOHEXOL  300 MG/ML  SOLN COMPARISON:  CT dated 06/29/2018. FINDINGS: Lower chest: Left lung base scarring with elevation of the left hemidiaphragm. The  visualized right lung base is clear. No intra-abdominal free air.  Small ascites. Hepatobiliary: There is mild irregularity of the liver contour. There is mild biliary dilatation. No calcified gallstone. Pancreas: Unremarkable. No pancreatic ductal dilatation or surrounding inflammatory changes. Spleen: Normal in size without focal abnormality. Adrenals/Urinary Tract: The renal glands are unremarkable. There is no hydronephrosis on either side. There is symmetric enhancement and excretion of contrast by both kidneys. The visualized ureters and urinary bladder appear unremarkable. Stomach/Bowel: There is a 2 cm ovoid structure in the distal small bowel in the left lower abdomen (61/2. There is dilatation of loops of small bowel proximal to this point measuring up to 4.8 cm in caliber. The distal small bowel collapse. This may represent an ingested matter or a passed gallstone. There is stranding and edema of the mesentery. The appendix is normal. Vascular/Lymphatic: Moderate aortoiliac atherosclerotic disease. The IVC is unremarkable. No portal venous gas. There is no adenopathy. Reproductive: The prostate and seminal vesicles are grossly unremarkable Other: Midline anterior peritoneal surgical clips. Musculoskeletal: Total right hip arthroplasty. Degenerative changes of the spine. No acute osseous pathology. IMPRESSION: 1. A 2 cm ingested matter or gallstone in the distal small bowel in the left lower abdomen  with findings of small-bowel obstruction or gallstone ileus. 2. Small ascites. 3.  Aortic Atherosclerosis (ICD10-I70.0). Electronically Signed   By: Angus Bark M.D.   On: 01/23/2024 13:17        Scheduled Meds:  bisacodyl   10 mg Rectal Daily   enoxaparin  (LOVENOX ) injection  40 mg Subcutaneous Q24H   folic acid   1 mg Oral Daily   lisinopril   10 mg Oral Daily   multivitamin with minerals  1 tablet Oral Daily   pantoprazole  (PROTONIX ) IV  40 mg Intravenous Q24H   thiamine   100 mg Oral Daily    Or   thiamine   100 mg Intravenous Daily     LOS: 1 day    Time spent: 35 minutes    Judi Jaffe Loran Rock, DO [facility]  If 7PM-7AM, please contact night-coverage [email or web address] 01/24/2024, 3:14 PM

## 2024-01-24 NOTE — Progress Notes (Signed)
 [facility] Progress Note     Subjective: Patient seen and examined.  He is resting comfortably in bed.  He denies abdominal pain, nausea, vomiting.  He states that he is passing gas and having bowel movements.  He is asking when he can have a diet.  Objective: Vital signs in last 24 hours: Temp:  [98.1 F (36.7 C)-99 F (37.2 C)] 98.2 F (36.8 C) (05/17 0556) Pulse Rate:  [77-116] 77 (05/17 0556) Resp:  [13-27] 20 (05/17 0124) BP: (131-187)/(78-122) 143/81 (05/17 0556) SpO2:  [95 %-99 %] 98 % (05/17 0556) Last BM Date : 01/24/24  Intake/Output from previous day: 05/16 0701 - 05/17 0700 In: 843.4 [I.V.:843.4] Out: -  Intake/Output this shift: Total I/O In: 633.8 [I.V.:633.8] Out: -   General appearance: alert, cooperative, and no distress GI: Abdomen soft, mild distention, no percussion tenderness, nontender to palpation; no rigidity, guarding, rebound tenderness  Lab Results:  Recent Labs    01/23/24 1113 01/24/24 0436  WBC 11.7* 5.5  HGB 14.9 11.6*  HCT 42.9 34.6*  PLT 233 168   BMET Recent Labs    01/23/24 1113 01/24/24 0436  NA 134* 134*  K 3.8 3.8  CL 104 107  CO2 17* 21*  GLUCOSE 121* 100*  BUN 16 13  CREATININE 0.91 0.84  CALCIUM  9.8 8.9   PT/INR No results for input(s): "LABPROT", "INR" in the last 72 hours.  Studies/Results: DG Abd 2 Views Result Date: 01/24/2024 CLINICAL DATA:  Small-bowel obstruction EXAM: ABDOMEN - 2 VIEW COMPARISON:  01/23/2024 FINDINGS: Two supine frontal views of the abdomen and pelvis are obtained. Mildly distended gas-filled loops of small bowel are seen within the left upper quadrant measuring up to 3.4 cm, consistent with the small-bowel obstruction observed on previous CT. Relative paucity of colonic gas. No abdominal masses. Numerous metallic densities within the left chest, abdomen, and posterior abdominal wall compatible with shrapnel as seen on previous abdominal CT. Lung bases are clear.  IMPRESSION: 1. Stable gaseous distension of the small bowel consistent with small-bowel obstruction. No significant change since prior study. Electronically Signed   By: Bobbye Burrow M.D.   On: 01/24/2024 12:06   CT ABDOMEN PELVIS W CONTRAST Result Date: 01/23/2024 CLINICAL DATA:  Abdominal pain. EXAM: CT ABDOMEN AND PELVIS WITH CONTRAST TECHNIQUE: Multidetector CT imaging of the abdomen and pelvis was performed using the standard protocol following bolus administration of intravenous contrast. RADIATION DOSE REDUCTION: This exam was performed according to the departmental dose-optimization program which includes automated exposure control, adjustment of the mA and/or kV according to patient size and/or use of iterative reconstruction technique. CONTRAST:  OMNIPAQUE  IOHEXOL  300 MG/ML  SOLN COMPARISON:  CT dated 06/29/2018. FINDINGS: Lower chest: Left lung base scarring with elevation of the left hemidiaphragm. The visualized right lung base is clear. No intra-abdominal free air.  Small ascites. Hepatobiliary: There is mild irregularity of the liver contour. There is mild biliary dilatation. No calcified gallstone. Pancreas: Unremarkable. No pancreatic ductal dilatation or surrounding inflammatory changes. Spleen: Normal in size without focal abnormality. Adrenals/Urinary Tract: The renal glands are unremarkable. There is no hydronephrosis on either side. There is symmetric enhancement and excretion of contrast by both kidneys. The visualized ureters and urinary bladder appear unremarkable. Stomach/Bowel: There is a 2 cm ovoid structure in the distal small bowel in the left lower abdomen (61/2. There is dilatation of loops of small bowel proximal to this point measuring up to 4.8 cm in caliber. The distal small bowel collapse.  This may represent an ingested matter or a passed gallstone. There is stranding and edema of the mesentery. The appendix is normal. Vascular/Lymphatic: Moderate aortoiliac  atherosclerotic disease. The IVC is unremarkable. No portal venous gas. There is no adenopathy. Reproductive: The prostate and seminal vesicles are grossly unremarkable Other: Midline anterior peritoneal surgical clips. Musculoskeletal: Total right hip arthroplasty. Degenerative changes of the spine. No acute osseous pathology. IMPRESSION: 1. A 2 cm ingested matter or gallstone in the distal small bowel in the left lower abdomen with findings of small-bowel obstruction or gallstone ileus. 2. Small ascites. 3.  Aortic Atherosclerosis (ICD10-I70.0). Electronically Signed   By: Angus Bark M.D.   On: 01/23/2024 13:17    Anti-infectives: Anti-infectives (From admission, onward)    None       Assessment/Plan:  Patient is a 78 year old male who was admitted with a small bowel obstruction.  - NG tube was unable to be placed yesterday secondary to coiling in the mouth and nasal trauma - Patient with some return of bowel function today - I discussed with him I recommend that we proceed with a small bowel obstruction protocol to prove that he no longer has an obstruction.  We discussed that in the past he has had bowel obstructions and been discharged home when he felt good, but then would return to the hospital within a day or 2 as the symptoms recurred.  By performing this imaging study, we can verify that the obstruction is truly resolved - NPO for now.  Okay for some sips of water  and ice chips - IV fluids per primary team - As needed pain control and antiemetics - Continue Dulcolax suppositories - Monitor bowel function - Further recommendations to follow small bowel obstruction protocol.  Discussed that if the imaging study does not demonstrate contrast in the colon, we will need to repeat imaging at 24 hours, and if this still demonstrates no contrast in the colon, he will need surgical intervention. - No acute surgical intervention at this time - Appreciate hospitalist recommendations    LOS: 1 day    Ladon Vandenberghe A Arieliz Latino 01/24/2024  Note: Portions of this report may have been transcribed using voice recognition software. Every effort has been made to ensure accuracy; however, inadvertent computerized transcription errors may still be present.

## 2024-01-24 NOTE — Plan of Care (Signed)

## 2024-01-25 DIAGNOSIS — K56609 Unspecified intestinal obstruction, unspecified as to partial versus complete obstruction: Secondary | ICD-10-CM | POA: Diagnosis not present

## 2024-01-25 MED ORDER — ONDANSETRON HCL 4 MG PO TABS: 4.0000 mg | ORAL_TABLET | Freq: Every day | ORAL | 1 refills | Status: AC | PRN

## 2024-01-25 NOTE — Plan of Care (Addendum)
 After sleep aide given and on rounds, patient seemed to be resting comfortably with eyes closed.  Addendum: No c/o abd pain, no nausea, & no vomiting during the shift. Patient c/o diarrhea during the shift.  Problem: Education: Goal: Knowledge of General Education information will improve Description: Including pain rating scale, medication(s)/side effects and non-pharmacologic comfort measures Outcome: Progressing   Problem: Health Behavior/Discharge Planning: Goal: Ability to manage health-related needs will improve Outcome: Progressing   Problem: Clinical Measurements: Goal: Ability to maintain clinical measurements within normal limits will improve Outcome: Progressing Goal: Will remain free from infection Outcome: Progressing Goal: Diagnostic test results will improve Outcome: Progressing Goal: Respiratory complications will improve Outcome: Progressing Goal: Cardiovascular complication will be avoided Outcome: Progressing   Problem: Activity: Goal: Risk for activity intolerance will decrease Outcome: Progressing   Problem: Nutrition: Goal: Adequate nutrition will be maintained Outcome: Progressing   Problem: Elimination: Goal: Will not experience complications related to urinary retention Outcome: Progressing   Problem: Pain Managment: Goal: General experience of comfort will improve and/or be controlled Outcome: Progressing   Problem: Safety: Goal: Ability to remain free from injury will improve Outcome: Progressing   Problem: Skin Integrity: Goal: Risk for impaired skin integrity will decrease Outcome: Progressing   Problem: Coping: Goal: Level of anxiety will decrease Outcome: Not Progressing   Problem: Elimination: Goal: Will not experience complications related to bowel motility Outcome: Not Progressing

## 2024-01-25 NOTE — Plan of Care (Signed)

## 2024-01-25 NOTE — Progress Notes (Signed)
 [facility] Progress Note     Subjective: Patient seen and examined.  He is resting comfortably in chair.  He tolerated his diet without nausea and vomiting.  He denies abdominal pain.  He has been having multiple loose bowel movements since completion of his small bowel obstruction protocol yesterday.  Objective: Vital signs in last 24 hours: Temp:  [97.6 F (36.4 C)-98.9 F (37.2 C)] 98.7 F (37.1 C) (05/18 0429) Pulse Rate:  [61-77] 62 (05/18 0429) Resp:  [16-20] 16 (05/18 0429) BP: (136-172)/(75-121) 149/82 (05/18 0429) SpO2:  [99 %] 99 % (05/18 0429) Last BM Date : 01/25/24  Intake/Output from previous day: 05/17 0701 - 05/18 0700 In: 873.8 [P.O.:240; I.V.:633.8] Out: -  Intake/Output this shift: No intake/output data recorded.  General appearance: alert, cooperative, and no distress GI: Abdomen soft, nondistended, no percussion tenderness, nontender to palpation; no rigidity, guarding, rebound tenderness  Lab Results:  Recent Labs    01/23/24 1113 01/24/24 0436  WBC 11.7* 5.5  HGB 14.9 11.6*  HCT 42.9 34.6*  PLT 233 168   BMET Recent Labs    01/23/24 1113 01/24/24 0436  NA 134* 134*  K 3.8 3.8  CL 104 107  CO2 17* 21*  GLUCOSE 121* 100*  BUN 16 13  CREATININE 0.91 0.84  CALCIUM  9.8 8.9   PT/INR No results for input(s): "LABPROT", "INR" in the last 72 hours.  Studies/Results: DG Abd Portable 1V-Small Bowel Obstruction Protocol-initial, 8 hr delay Result Date: 01/24/2024 CLINICAL DATA:  8 hour delay. EXAM: PORTABLE ABDOMEN - 1 VIEW COMPARISON:  Abdominal x-ray 01/24/2024. CT abdomen and pelvis 01/23/2024. FINDINGS: Oral contrast is seen to the level of the rectum. The colon is nondilated. Small bowel loops are no longer dilated. Multiple punctate radiopaque foreign bodies project over the left hip and pelvis, unchanged. Right hip arthroplasty is present. IMPRESSION: 1. No evidence for bowel obstruction. Oral contrast is seen to the level  of the rectum. Electronically Signed   By: Tyron Gallon M.D.   On: 01/24/2024 18:57   DG Abd 2 Views Result Date: 01/24/2024 CLINICAL DATA:  Small-bowel obstruction EXAM: ABDOMEN - 2 VIEW COMPARISON:  01/23/2024 FINDINGS: Two supine frontal views of the abdomen and pelvis are obtained. Mildly distended gas-filled loops of small bowel are seen within the left upper quadrant measuring up to 3.4 cm, consistent with the small-bowel obstruction observed on previous CT. Relative paucity of colonic gas. No abdominal masses. Numerous metallic densities within the left chest, abdomen, and posterior abdominal wall compatible with shrapnel as seen on previous abdominal CT. Lung bases are clear. IMPRESSION: 1. Stable gaseous distension of the small bowel consistent with small-bowel obstruction. No significant change since prior study. Electronically Signed   By: Bobbye Burrow M.D.   On: 01/24/2024 12:06   CT ABDOMEN PELVIS W CONTRAST Result Date: 01/23/2024 CLINICAL DATA:  Abdominal pain. EXAM: CT ABDOMEN AND PELVIS WITH CONTRAST TECHNIQUE: Multidetector CT imaging of the abdomen and pelvis was performed using the standard protocol following bolus administration of intravenous contrast. RADIATION DOSE REDUCTION: This exam was performed according to the departmental dose-optimization program which includes automated exposure control, adjustment of the mA and/or kV according to patient size and/or use of iterative reconstruction technique. CONTRAST:  OMNIPAQUE  IOHEXOL  300 MG/ML  SOLN COMPARISON:  CT dated 06/29/2018. FINDINGS: Lower chest: Left lung base scarring with elevation of the left hemidiaphragm. The visualized right lung base is clear. No intra-abdominal free air.  Small ascites. Hepatobiliary: There is mild  irregularity of the liver contour. There is mild biliary dilatation. No calcified gallstone. Pancreas: Unremarkable. No pancreatic ductal dilatation or surrounding inflammatory changes. Spleen: Normal  in size without focal abnormality. Adrenals/Urinary Tract: The renal glands are unremarkable. There is no hydronephrosis on either side. There is symmetric enhancement and excretion of contrast by both kidneys. The visualized ureters and urinary bladder appear unremarkable. Stomach/Bowel: There is a 2 cm ovoid structure in the distal small bowel in the left lower abdomen (61/2. There is dilatation of loops of small bowel proximal to this point measuring up to 4.8 cm in caliber. The distal small bowel collapse. This may represent an ingested matter or a passed gallstone. There is stranding and edema of the mesentery. The appendix is normal. Vascular/Lymphatic: Moderate aortoiliac atherosclerotic disease. The IVC is unremarkable. No portal venous gas. There is no adenopathy. Reproductive: The prostate and seminal vesicles are grossly unremarkable Other: Midline anterior peritoneal surgical clips. Musculoskeletal: Total right hip arthroplasty. Degenerative changes of the spine. No acute osseous pathology. IMPRESSION: 1. A 2 cm ingested matter or gallstone in the distal small bowel in the left lower abdomen with findings of small-bowel obstruction or gallstone ileus. 2. Small ascites. 3.  Aortic Atherosclerosis (ICD10-I70.0). Electronically Signed   By: Angus Bark M.D.   On: 01/23/2024 13:17    Anti-infectives: Anti-infectives (From admission, onward)    None       Assessment/Plan:  Patient is 78 year old male who was admitted with a small bowel obstruction.  - Small bowel obstruction protocol yesterday demonstrate contrast within the colon at 8 hours - NG tube was removed and regular diet was ordered - Patient tolerated diet without issue and continues to have bowel function - Patient stable for discharge from a general surgery standpoint - Appreciate hospitalist recommendations   LOS: 2 days    [place] Decoteau A Rohnan Bartleson 01/25/2024  Note: Portions of this report may have been transcribed  using voice recognition software. Every effort has been made to ensure accuracy; however, inadvertent computerized transcription errors may still be present.

## 2024-01-25 NOTE — Discharge Summary (Signed)
 Physician Discharge Summary  Garrett Higgins BJY:[id number] DOB: May 02, 1946 DOA: 01/23/2024  PCP: Kathyleen Parkins, MD  Admit date: 01/23/2024  Discharge date: 01/25/2024  Admitted From:Home  Disposition:  Home  Recommendations for Outpatient Follow-up:  Follow up with PCP in 1-2 weeks Zofran  as needed for nausea or vomiting Continue other home medications as prior  Home Health: None  Equipment/Devices: None  Discharge Condition:Stable  CODE STATUS: Full  Diet recommendation: Heart Healthy  Brief/Interim Summary: Garrett Higgins is a 78 y.o. male with medical history significant for dyslipidemia, hypertension, arthritis, PTSD, and GERD who presented to the ED with complaints of abdominal pain as well as nausea and vomiting that began shortly after supper last night.  He was admitted for suspicion of SBO that improved fairly rapidly with conservative management during the course of this admission.  Several attempts were made to place NG tube, but this could not be done and despite this he started to have return of bowel function spontaneously.  He underwent SBFT on 5/17 and diet was advanced which he is now tolerating.  No other acute events or concerns noted and he is stable for discharge.  Discharge Diagnoses:  Principal Problem:   SBO (small bowel obstruction) (HCC) partial Active Problems:   HTN (hypertension)   GERD (gastroesophageal reflux disease)   Hyperlipidemia   Osteoarthritis of right hip  Principal discharge diagnosis: SBO-spontaneously resolved.  Discharge Instructions  Discharge Instructions     Diet - low sodium heart healthy   Complete by: As directed    Increase activity slowly   Complete by: As directed       Allergies as of 01/25/2024   No Known Allergies      Medication List     TAKE these medications    ALLERGY PO Take 1 tablet by mouth daily.   butalbital -acetaminophen -caffeine  50-325-40 MG tablet Commonly known as:  FIORICET Take 1 tablet by mouth 3 (three) times daily as needed for headache or migraine.   diclofenac  75 MG EC tablet Commonly known as: VOLTAREN  Take 75 mg by mouth 2 (two) times daily.   HYDROcodone -acetaminophen  10-325 MG tablet Commonly known as: NORCO Take 1 tablet by mouth 2 (two) times daily.   IRON PO Take 1 tablet by mouth daily.   lisinopril  10 MG tablet Commonly known as: ZESTRIL  Take 10 mg by mouth daily.   omeprazole 20 MG capsule Commonly known as: PRILOSEC Take 20 mg by mouth daily.   ondansetron  4 MG tablet Commonly known as: Zofran  Take 1 tablet (4 mg total) by mouth daily as needed for nausea or vomiting.   rosuvastatin  40 MG tablet Commonly known as: CRESTOR  Take 20 mg by mouth at bedtime.        Follow-up Information     Kathyleen Parkins, MD. Schedule an appointment as soon as possible for a visit in 1 week(s).   Specialty: Internal Medicine Contact information: [address] [place] [address] [phone]                No Known Allergies  Consultations: GEN surgery   Procedures/Studies: DG Abd Portable 1V-Small Bowel Obstruction Protocol-initial, 8 hr delay Result Date: 01/24/2024 CLINICAL DATA:  8 hour delay. EXAM: PORTABLE ABDOMEN - 1 VIEW COMPARISON:  Abdominal x-ray 01/24/2024. CT abdomen and pelvis 01/23/2024. FINDINGS: Oral contrast is seen to the level of the rectum. The colon is nondilated. Small bowel loops are no longer dilated. Multiple punctate radiopaque foreign bodies project over the left hip and pelvis,  unchanged. Right hip arthroplasty is present. IMPRESSION: 1. No evidence for bowel obstruction. Oral contrast is seen to the level of the rectum. Electronically Signed   By: Tyron Gallon M.D.   On: 01/24/2024 18:57   DG Abd 2 Views Result Date: 01/24/2024 CLINICAL DATA:  Small-bowel obstruction EXAM: ABDOMEN - 2 VIEW COMPARISON:  01/23/2024 FINDINGS: Two supine frontal views of the abdomen and pelvis are  obtained. Mildly distended gas-filled loops of small bowel are seen within the left upper quadrant measuring up to 3.4 cm, consistent with the small-bowel obstruction observed on previous CT. Relative paucity of colonic gas. No abdominal masses. Numerous metallic densities within the left chest, abdomen, and posterior abdominal wall compatible with shrapnel as seen on previous abdominal CT. Lung bases are clear. IMPRESSION: 1. Stable gaseous distension of the small bowel consistent with small-bowel obstruction. No significant change since prior study. Electronically Signed   By: Bobbye Burrow M.D.   On: 01/24/2024 12:06   CT ABDOMEN PELVIS W CONTRAST Result Date: 01/23/2024 CLINICAL DATA:  Abdominal pain. EXAM: CT ABDOMEN AND PELVIS WITH CONTRAST TECHNIQUE: Multidetector CT imaging of the abdomen and pelvis was performed using the standard protocol following bolus administration of intravenous contrast. RADIATION DOSE REDUCTION: This exam was performed according to the departmental dose-optimization program which includes automated exposure control, adjustment of the mA and/or kV according to patient size and/or use of iterative reconstruction technique. CONTRAST:  OMNIPAQUE  IOHEXOL  300 MG/ML  SOLN COMPARISON:  CT dated 06/29/2018. FINDINGS: Lower chest: Left lung base scarring with elevation of the left hemidiaphragm. The visualized right lung base is clear. No intra-abdominal free air.  Small ascites. Hepatobiliary: There is mild irregularity of the liver contour. There is mild biliary dilatation. No calcified gallstone. Pancreas: Unremarkable. No pancreatic ductal dilatation or surrounding inflammatory changes. Spleen: Normal in size without focal abnormality. Adrenals/Urinary Tract: The renal glands are unremarkable. There is no hydronephrosis on either side. There is symmetric enhancement and excretion of contrast by both kidneys. The visualized ureters and urinary bladder appear unremarkable.  Stomach/Bowel: There is a 2 cm ovoid structure in the distal small bowel in the left lower abdomen (61/2. There is dilatation of loops of small bowel proximal to this point measuring up to 4.8 cm in caliber. The distal small bowel collapse. This may represent an ingested matter or a passed gallstone. There is stranding and edema of the mesentery. The appendix is normal. Vascular/Lymphatic: Moderate aortoiliac atherosclerotic disease. The IVC is unremarkable. No portal venous gas. There is no adenopathy. Reproductive: The prostate and seminal vesicles are grossly unremarkable Other: Midline anterior peritoneal surgical clips. Musculoskeletal: Total right hip arthroplasty. Degenerative changes of the spine. No acute osseous pathology. IMPRESSION: 1. A 2 cm ingested matter or gallstone in the distal small bowel in the left lower abdomen with findings of small-bowel obstruction or gallstone ileus. 2. Small ascites. 3.  Aortic Atherosclerosis (ICD10-I70.0). Electronically Signed   By: Angus Bark M.D.   On: 01/23/2024 13:17     Discharge Exam: Vitals:   01/25/24 0306 01/25/24 0429  BP: 136/75 (!) 149/82  Pulse: 61 62  Resp:  16  Temp:  98.7 F (37.1 C)  SpO2:  99%   Vitals:   01/24/24 1316 01/24/24 1955 01/25/24 0306 01/25/24 0429  BP: (!) 172/94 (!) 144/121 136/75 (!) 149/82  Pulse: 72 77 61 62  Resp: 20 20  16   Temp: 98.9 F (37.2 C) 97.6 F (36.4 C)  98.7 F (37.1 C)  TempSrc:  Oral Oral  Oral  SpO2: 99% 99%  99%  Weight:      Height:        General: Pt is alert, awake, not in acute distress Cardiovascular: RRR, S1/S2 +, no rubs, no gallops Respiratory: CTA bilaterally, no wheezing, no rhonchi Abdominal: Soft, NT, ND, bowel sounds + Extremities: no edema, no cyanosis    The results of significant diagnostics from this hospitalization (including imaging, microbiology, ancillary and laboratory) are listed below for reference.     Microbiology: No results found for this or any  previous visit (from the past 240 hours).   Labs: BNP (last 3 results) No results for input(s): "BNP" in the last 8760 hours. Basic Metabolic Panel: Recent Labs  Lab 01/23/24 1113 01/24/24 0436  NA 134* 134*  K 3.8 3.8  CL 104 107  CO2 17* 21*  GLUCOSE 121* 100*  BUN 16 13  CREATININE 0.91 0.84  CALCIUM  9.8 8.9  MG  --  1.9   Liver Function Tests: Recent Labs  Lab 01/23/24 1113 01/24/24 0436  AST 19 14*  ALT 13 8  ALKPHOS 81 61  BILITOT 1.0 0.7  PROT 6.5 5.1*  ALBUMIN 3.6 2.8*   Recent Labs  Lab 01/23/24 1113  LIPASE 33   No results for input(s): "AMMONIA" in the last 168 hours. CBC: Recent Labs  Lab 01/23/24 1113 01/24/24 0436  WBC 11.7* 5.5  HGB 14.9 11.6*  HCT 42.9 34.6*  MCV 95.5 99.4  PLT 233 168   Cardiac Enzymes: No results for input(s): "CKTOTAL", "CKMB", "CKMBINDEX", "TROPONINI" in the last 168 hours. BNP: Invalid input(s): "POCBNP" CBG: No results for input(s): "GLUCAP" in the last 168 hours. D-Dimer No results for input(s): "DDIMER" in the last 72 hours. Hgb A1c No results for input(s): "HGBA1C" in the last 72 hours. Lipid Profile No results for input(s): "CHOL", "HDL", "LDLCALC", "TRIG", "CHOLHDL", "LDLDIRECT" in the last 72 hours. Thyroid  function studies No results for input(s): "TSH", "T4TOTAL", "T3FREE", "THYROIDAB" in the last 72 hours.  Invalid input(s): "FREET3" Anemia work up No results for input(s): "VITAMINB12", "FOLATE", "FERRITIN", "TIBC", "IRON", "RETICCTPCT" in the last 72 hours. Urinalysis    Component Value Date/Time   COLORURINE YELLOW 06/29/2018 1846   APPEARANCEUR CLEAR 06/29/2018 1846   LABSPEC 1.036 (H) 06/29/2018 1846   PHURINE 6.0 06/29/2018 1846   GLUCOSEU NEGATIVE 06/29/2018 1846   HGBUR NEGATIVE 06/29/2018 1846   BILIRUBINUR NEGATIVE 06/29/2018 1846   KETONESUR NEGATIVE 06/29/2018 1846   PROTEINUR NEGATIVE 06/29/2018 1846   UROBILINOGEN 1.0 07/14/2015 0930   NITRITE NEGATIVE 06/29/2018 1846    LEUKOCYTESUR NEGATIVE 06/29/2018 1846   Sepsis Labs Recent Labs  Lab 01/23/24 1113 01/24/24 0436  WBC 11.7* 5.5   Microbiology No results found for this or any previous visit (from the past 240 hours).   Time coordinating discharge: 35 minutes  SIGNED:   Cornelius Dill, DO [facility] 01/25/2024, 10:27 AM  If 7PM-7AM, please contact night-coverage [email or web address]

## 2024-01-25 NOTE — Progress Notes (Signed)
 Patient is c/o of diarrhea that has been going on all day. I explained to the patient that his bowel obstruction is getting better according to the xray and we don't want to slow down his bowel motility. Patient agitated/irritable and wants something for his diarrhea. I reached out to Dr.Pappayliou about patient diarrhea and agrees that patient does not need an antidiarrheal due to the fact that his bowel obstruction is getting better and we don't want to slow down bowel transit. Once I told him what Dr. Cherilyn Corn stated, patient seems to calm down.

## 2024-01-25 NOTE — Progress Notes (Signed)
 Patient is making comments about wanting to die, not wanting to harm or kill himself but wanting to die. When I talked in depth with patient, he is just upset about being here, not being able to sleep, his diarrhea & wanting to go home. Dr.Adefeso notified.

## 2024-03-15 DIAGNOSIS — Z6824 Body mass index (BMI) 24.0-24.9, adult: Secondary | ICD-10-CM | POA: Diagnosis not present

## 2024-03-15 DIAGNOSIS — M47816 Spondylosis without myelopathy or radiculopathy, lumbar region: Secondary | ICD-10-CM | POA: Diagnosis not present

## 2024-03-15 DIAGNOSIS — M1991 Primary osteoarthritis, unspecified site: Secondary | ICD-10-CM | POA: Diagnosis not present

## 2024-03-15 DIAGNOSIS — G894 Chronic pain syndrome: Secondary | ICD-10-CM | POA: Diagnosis not present

## 2024-03-15 DIAGNOSIS — I10 Essential (primary) hypertension: Secondary | ICD-10-CM | POA: Diagnosis not present

## 2024-04-15 DIAGNOSIS — I10 Essential (primary) hypertension: Secondary | ICD-10-CM | POA: Diagnosis not present

## 2024-04-15 DIAGNOSIS — G894 Chronic pain syndrome: Secondary | ICD-10-CM | POA: Diagnosis not present

## 2024-04-15 DIAGNOSIS — Z6825 Body mass index (BMI) 25.0-25.9, adult: Secondary | ICD-10-CM | POA: Diagnosis not present

## 2024-04-15 DIAGNOSIS — M1991 Primary osteoarthritis, unspecified site: Secondary | ICD-10-CM | POA: Diagnosis not present

## 2024-04-15 DIAGNOSIS — M47816 Spondylosis without myelopathy or radiculopathy, lumbar region: Secondary | ICD-10-CM | POA: Diagnosis not present

## 2024-04-20 DIAGNOSIS — M542 Cervicalgia: Secondary | ICD-10-CM | POA: Diagnosis not present

## 2024-04-20 DIAGNOSIS — Z6825 Body mass index (BMI) 25.0-25.9, adult: Secondary | ICD-10-CM | POA: Diagnosis not present

## 2024-05-13 DIAGNOSIS — G894 Chronic pain syndrome: Secondary | ICD-10-CM | POA: Diagnosis not present

## 2024-05-13 DIAGNOSIS — I10 Essential (primary) hypertension: Secondary | ICD-10-CM | POA: Diagnosis not present

## 2024-05-13 DIAGNOSIS — M542 Cervicalgia: Secondary | ICD-10-CM | POA: Diagnosis not present

## 2024-05-13 DIAGNOSIS — Z6825 Body mass index (BMI) 25.0-25.9, adult: Secondary | ICD-10-CM | POA: Diagnosis not present

## 2024-05-13 DIAGNOSIS — M1991 Primary osteoarthritis, unspecified site: Secondary | ICD-10-CM | POA: Diagnosis not present
